# Patient Record
Sex: Male | Born: 1995 | Race: White | Hispanic: No | Marital: Single | Smoking: Never smoker
Health system: Southern US, Community
[De-identification: ages and names within clinical notes are randomized; demographics above are authoritative.]

## PROBLEM LIST (undated history)

## (undated) DIAGNOSIS — F909 Attention-deficit hyperactivity disorder, unspecified type: Secondary | ICD-10-CM

## (undated) HISTORY — DX: Attention-deficit hyperactivity disorder, unspecified type: F90.9

## (undated) HISTORY — PX: TYMPANOSTOMY TUBE PLACEMENT: SHX32

---
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
## 2017-01-06 ENCOUNTER — Other Ambulatory Visit: Payer: Self-pay | Admitting: Family

## 2017-01-06 MED ORDER — EVEKEO 10 MG PO TABS: 10.0000 mg | ORAL_TABLET | Freq: Every day | ORAL | 0 refills | Status: AC

## 2017-01-06 NOTE — Telephone Encounter (Signed)
Mom called in a new RX request for [employer] for [employer]. They will Pick up this RX. Pt has apt scheduled for December 18. That's when he will be home from school.  Approved by provider. jd

## 2017-01-06 NOTE — Telephone Encounter (Signed)
Printed Rx for [name] 10 mg and placed at front desk for pick-up  

## 2017-04-08 ENCOUNTER — Encounter: Payer: Self-pay | Admitting: Family

## 2017-04-08 ENCOUNTER — Ambulatory Visit: Payer: 59 | Admitting: Family

## 2017-04-08 VITALS — BP 110/64 | HR 68 | Resp 16 | Ht 73.75 in | Wt 195.6 lb

## 2017-04-08 DIAGNOSIS — F900 Attention-deficit hyperactivity disorder, predominantly inattentive type: Secondary | ICD-10-CM | POA: Diagnosis not present

## 2017-04-08 DIAGNOSIS — Z719 Counseling, unspecified: Secondary | ICD-10-CM

## 2017-04-08 DIAGNOSIS — Z79899 Other long term (current) drug therapy: Secondary | ICD-10-CM | POA: Diagnosis not present

## 2017-04-08 NOTE — Progress Notes (Signed)
[facility] [address] [place] [address] Dept: [phone] Dept Fax: [phone] Loc: [phone] Loc Fax: [phone]  Medical Follow-up  Patient ID: Derrick Reeves, male  DOB: 1995/12/07, 21 y.o.  MRN: [id number]  Date of Evaluation: 04/08/17  PCP: Eartha InchBadger, Michael C, MD  Accompanied by: self Patient Lives with: on campus   HISTORY/CURRENT STATUS:  HPI  Patient here for routine follow up related to ADHD, learning problems, and medication management. Patient here with his mother today for the visit. Patient doing well at school with a 3.02 GPA this 1st semester and will stay through the spring semester then transfer closer to home. Has not taken his medication consistently and been non-compliant most days, but no side effects reported.   EDUCATION: School: [employer]  Year/Grade: Freshman Homework Time:Increased amount of time each night. Performance/Grades: average Services: Other: Disability Services Activities/Exercise: daily workouts, lifting 3 days/week, 05/29/17 1st game  MEDICAL HISTORY: Appetite: Good  MVI/Other: None Fruits/Vegs:Some Calcium: Some Iron:Some  Sleep: Bedtime: varies each night Sleep Concerns: Initiation/Maintenance/Other: depends on the night for how much sleep he gets  Individual Medical History/Review of System Changes? None reported recently  Allergies: Patient has no known allergies.  Current Medications:  Current Outpatient Medications:  .  [name] 10 MG TABS, Take 10-20 mg by mouth daily., Disp: 60 tablet, Rfl: 0 Medication Side Effects: None  Family Medical/Social History Changes?: None  MENTAL HEALTH: Mental Health Issues: None reported   PHYSICAL EXAM: Vitals:  Today's Vitals   04/08/17 1530  BP: 110/64  Pulse: 68  Resp: 16  Weight: 195 lb 9.6 oz (88.7 kg)  Height: 6' 1.75"  (1.873 m)  PainSc: 0-No pain  , 77 %ile (Z= 0.74) based on CDC (Boys, 2-20 Years) BMI-for-age based on BMI available as of 04/08/2017.  General Exam: Physical Exam  Constitutional: He is oriented to person, place, and time. He appears well-developed and well-nourished.  HENT:  Head: Normocephalic and atraumatic.  Right Ear: External ear normal.  Left Ear: External ear normal.  Nose: Nose normal.  Mouth/Throat: Oropharynx is clear and moist.  Eyes: Conjunctivae and EOM are normal. Pupils are equal, round, and reactive to light.  Neck: Trachea normal, normal range of motion and full passive range of motion without pain. Neck supple.  Cardiovascular: Normal rate, regular rhythm, normal heart sounds and intact distal pulses.  Pulmonary/Chest: Effort normal and breath sounds normal.  Abdominal: Soft. Bowel sounds are normal.  Genitourinary:  Genitourinary Comments: Deferred  Musculoskeletal: Normal range of motion.  Neurological: He is alert and oriented to person, place, and time. He has normal reflexes.  Skin: Skin is warm, dry and intact. Capillary refill takes less than 2 seconds.  Psychiatric: He has a normal mood and affect. His behavior is normal. Judgment and thought content normal.  Vitals reviewed.  Review of Systems  Psychiatric/Behavioral: Positive for decreased concentration.  All other systems reviewed and are negative.  Patient with no concerns for toileting. Daily stool, no constipation or diarrhea. Void urine no difficulty. No enuresis.   Participate in daily oral hygiene to include brushing and flossing.  Neurological: oriented to time, place, and person Cranial Nerves: normal  Neuromuscular:  Motor Mass: Normal Tone: Normal  Strength: Normal DTRs: 2+ and symmetric Overflow: None Reflexes: no tremors noted Sensory Exam: Vibratory: Intact  Fine Touch: Intact  Testing/Developmental Screens:  ASRS-10/2 scored by patient and counseled at today's  visit     DIAGNOSES:    ICD-10-CM   1. ADHD (attention deficit hyperactivity disorder), inattentive type F90.0   2. Medication management Z79.899   3. Patient counseled Z71.9     RECOMMENDATIONS: 3 month follow up or 6 month depending on patient's schedule for spring at home. Counseled on adherence and medication management with [name] 10 mg 1-2 tablets daily, prn. Non-compliant most of the fall semester and no Rx provided at today's visit. To restart this coming semester.   Counseled patient along with mother on medication administration, effects, and possible side effects. ADHD medications discussed to include different medications and pharmacologic properties of each. Recommendation for specific medication to include dose, administration, expected effects, possible side effects and the risk to benefit ratio of medication management at today's visit for [name] daily dosing of 10 mg BID.   Information regarding school progress provided by patient with positive progress the 1st semester and will stay for 2nd semester freshman year. Discussed transferring schools for next year closer to home and provided support.   Patient encouraged to get enough sleep with school and baseball daily. Reviewed sleep hygiene while at college to ensure his ability to focus and decrease the likelihood of injuries.   Recommended proper nutrition with increased exercise daily. Discussed calories and protein to assist with muscle building and support with increase daily actiivty.  Directed to f/u with PCP routinely, dentist as recommended, MVI daily, good nutrition daily, sleep hygiene and exercise daily for health maintenance.   NEXT APPOINTMENT: No Follow-up on file.  More than 50% of the appointment was spent counseling and discussing diagnosis and management of symptoms with the patient and family.  Carron Curieawn M Paretta-Leahey, NP Counseling Time: 30 mins Total Contact Time: 40 mins

## 2017-09-08 ENCOUNTER — Ambulatory Visit: Payer: 59 | Attending: Orthopedic Surgery | Admitting: Physical Therapy

## 2017-09-08 ENCOUNTER — Encounter: Payer: Self-pay | Admitting: Physical Therapy

## 2017-09-08 DIAGNOSIS — M62838 Other muscle spasm: Secondary | ICD-10-CM | POA: Diagnosis present

## 2017-09-08 DIAGNOSIS — M25611 Stiffness of right shoulder, not elsewhere classified: Secondary | ICD-10-CM | POA: Diagnosis present

## 2017-09-08 DIAGNOSIS — M25511 Pain in right shoulder: Secondary | ICD-10-CM | POA: Insufficient documentation

## 2017-09-08 DIAGNOSIS — R600 Localized edema: Secondary | ICD-10-CM | POA: Diagnosis present

## 2017-09-08 NOTE — Patient Instructions (Signed)
Trigger Point Dry Needling  . What is Trigger Point Dry Needling (DN)? o DN is a physical therapy technique used to treat muscle pain and dysfunction. Specifically, DN helps deactivate muscle trigger points (muscle knots).  o A thin filiform needle is used to penetrate the skin and stimulate the underlying trigger point. The goal is for a local twitch response (LTR) to occur and for the trigger point to relax. No medication of any kind is injected during the procedure.   . What Does Trigger Point Dry Needling Feel Like?  o The procedure feels different for each individual patient. Some patients report that they do not actually feel the needle enter the skin and overall the process is not painful. Very mild bleeding may occur. However, many patients feel a deep cramping in the muscle in which the needle was inserted. This is the local twitch response.   Marland Kitchen How Will I feel after the treatment? o Soreness is normal, and the onset of soreness may not occur for a few hours. Typically this soreness does not last longer than two days.  o Bruising is uncommon, however; ice can be used to decrease any possible bruising.  o In rare cases feeling tired or nauseous after the treatment is normal. In addition, your symptoms may get worse before they get better, this period will typically not last longer than 24 hours.   . What Can I do After My Treatment? o Increase your hydration by drinking more water for the next 24 hours. o You may place ice or heat on the areas treated that have become sore, however, do not use heat on inflamed or bruised areas. Heat often brings more relief post needling. o You can continue your regular activities, but vigorous activity is not recommended initially after the treatment for 24 hours. o DN is best combined with other physical therapy such as strengthening, stretching, and other therapies.     Access Code: [email or web address]  URL: [email or web address]  Date:  09/08/2017  Prepared by: Stacie Glaze   Exercises  Shoulder Extension with Resistance - 10 reps - 3 sets - 1x daily - 7x weekly  Standing Lat Pull Down with Resistance - Elbows Bent - 10 reps - 3 sets - 5 hold - 1x daily - 7x weekly  Standing Shoulder Row with Anchored Resistance - 10 reps - 3 sets - 5 hold - 1x daily - 7x weekly  Standing Shoulder Horizontal Abduction with Resistance - 10 reps - 3 sets - 5 hold - 1x daily - 7x weekly  Shoulder External Rotation and Scapular Retraction with Resistance - 10 reps - 3 sets - 1x daily - 7x weekly  Dynamic Hug with Resistance - 10 reps - 3 sets - 5 hold - 1x daily - 7x weekly

## 2017-09-08 NOTE — Therapy (Signed)
[facility] [address] [place], [place], [address] Phone: [phone]   Fax:  [phone]  Physical Therapy Evaluation  Patient Details  Name: Derrick Reeves MRN: [id number] Date of Birth: 29-Jul-1995 Referring Provider: Dr. Stephanie Coup  at [facility] sports medicine, Dion Saucier   Encounter Date: 09/08/2017  PT End of Session - 09/08/17 1339    Visit Number  1    Date for PT Re-Evaluation  11/08/17    PT Start Time  1300    PT Stop Time  1355    PT Time Calculation (min)  55 min    Activity Tolerance  Patient tolerated treatment well    Behavior During Therapy  [facility] for tasks assessed/performed       Past Medical History:  Diagnosis Date  . ADHD (attention deficit hyperactivity disorder)     Past Surgical History:  Procedure Laterality Date  . TYMPANOSTOMY TUBE PLACEMENT      There were no vitals filed for this visit.   Subjective Assessment - 09/08/17 1257    Subjective  Patient is a college baseball player, reports that he has been having pain for about 2 months, just finished freshmen year.  Patient plays 3rd base, is right handed.  He reports that the trainer at school tried dry needling and cupping, reports this did not help.      Patient Stated Goals  have less pain    Currently in Pain?  Yes    Pain Score  2     Pain Location  Shoulder    Pain Orientation  Right    Pain Descriptors / Indicators  Aching "a catch"    Pain Type  Acute pain    Pain Onset  More than a month ago    Pain Frequency  Intermittent    Aggravating Factors   throwing, bench press, reaching overhead, pushups, pain up to 8/10    Pain Relieving Factors  rest, not throwing, pain is a 2/10 at best    Effect of Pain on Daily Activities  limits throwing, limits working out         [facility] PT Assessment - 09/08/17 0001      Assessment   Medical Diagnosis  right shoulder pain    Referring Provider  Dr. Stephanie Coup  at [facility]  sports medicine, Dion Saucier    Onset Date/Surgical Date  11/08/17    Hand Dominance  Right    Prior Therapy  with trainer at school      Precautions   Precautions  None      Balance Screen   Has the patient fallen in the past 6 months  No    Has the patient had a decrease in activity level because of a fear of falling?   No    Is the patient reluctant to leave their home because of a fear of falling?   No      Home Environment   Additional Comments  independent      Prior Function   Level of Independence  Independent    Vocation  Student    Vocation Requirements  college baseball player, lifts weights    Leisure  baseball year round      Posture/Postural Control   Posture Comments  fwd head, rounded shoulders, winged scapula      ROM / Strength   AROM / PROM / Strength  AROM;Strength;PROM      AROM   AROM Assessment  Site  Shoulder    Right/Left Shoulder  Right    Right Shoulder Flexion  155 Degrees pain    Right Shoulder ABduction  155 Degrees pain    Right Shoulder Internal Rotation  56 Degrees    Right Shoulder External Rotation  85 Degrees      PROM   PROM Assessment Site  Shoulder    Right/Left Shoulder  Right    Right Shoulder Flexion  175 Degrees pain    Right Shoulder ABduction  170 Degrees    Right Shoulder Internal Rotation  75 Degrees pain    Right Shoulder External Rotation  90 Degrees      Strength   Overall Strength Comments  flexion, abduction and IR 4+/5 with mild pain, ER was 4-/5 with pain a 7/10      Palpation   Palpation comment  he is very tender along the infraspinatus mms, he reports"that is it" when palpating the mm bellis of the infraspinatus , he does have winged scapulae that I can get my fingers underneath      Special Tests   Other special tests  negative empty can, + impingemt                Objective measurements completed on examination: See above findings.      [facility] Adult PT Treatment/Exercise - 09/08/17 0001       Modalities   Modalities  Moist Heat;Electrical Stimulation      Moist Heat Therapy   Number Minutes Moist Heat  15 Minutes    Moist Heat Location  Shoulder      Electrical Stimulation   Electrical Stimulation Location  right scapular area    Electrical Stimulation Action  IFC    Electrical Stimulation Parameters  supine    Electrical Stimulation Goals  Pain       Trigger Point Dry Needling - 09/08/17 1341    Consent Given?  Yes    Education Handout Provided  Yes    Muscles Treated Upper Body  Infraspinatus    Infraspinatus Response  Twitch response elicited             PT Short Term Goals - 09/08/17 1350      PT SHORT TERM GOAL #1   Title  independent with initial HEP    Time  2    Period  Weeks    Status  New        PT Long Term Goals - 09/08/17 1351      PT LONG TERM GOAL #1   Title  decrease pain 50%    Time  8    Period  Weeks    Status  New      PT LONG TERM GOAL #2   Title  increase ROM to WNL's    Time  8    Period  Weeks    Status  New      PT LONG TERM GOAL #3   Title  increase ER strength to 4+/5    Time  8    Period  Weeks    Status  New      PT LONG TERM GOAL #4   Title  throw without pain    Time  8    Period  Weeks    Status  New             Plan - 09/08/17 1341    Clinical Impression Statement  Patient is a Scientist, physiological,  he is right handed, he reports that he has been having right shoulder/scapular pain for 2 months.  He is unsure of a specific cause.  Reports pain throwing, pushing, pushups, reaching overhead.  Negative RC tests, + impingment test, has scapular winging, very tight and tender in the right infraspinatus.  He has dsome limitations in his ROM with the most limitation being IR, he is very weak with ER due to pain    Clinical Presentation  Stable    Clinical Decision Making  Low    Rehab Potential  Good    PT Frequency  2x / week    PT Duration  8 weeks    PT Treatment/Interventions  ADLs/Self Care  Home Management;Cryotherapy;Electrical Stimulation;Moist Heat;Iontophoresis /ml Dexamethasone;Neuromuscular re-education;Therapeutic exercise;Therapeutic activities;Patient/family education;Manual techniques;Dry needling;Vasopneumatic Device    PT Next Visit Plan  start scapular stabilization, see if dry needling helped    Consulted and Agree with Plan of Care  Patient       Patient will benefit from skilled therapeutic intervention in order to improve the following deficits and impairments:  Increased fascial restricitons, Decreased range of motion, Increased muscle spasms, Pain, Hypermobility, Impaired flexibility, Improper body mechanics, Postural dysfunction, Decreased strength, Increased edema  Visit Diagnosis: Acute pain of right shoulder - Plan: PT plan of care cert/re-cert  Other muscle spasm - Plan: PT plan of care cert/re-cert  Stiffness of right shoulder, not elsewhere classified - Plan: PT plan of care cert/re-cert  Localized edema - Plan: PT plan of care cert/re-cert     Problem List Patient Active Problem List   Diagnosis Date Noted  . ADHD (attention deficit hyperactivity disorder), inattentive type 09/15/2015    Jearld Lesch., PT 09/08/2017, 1:54 PM  [facility] [place] [facility] [address] [place], [place], [address] Phone: [phone]   Fax:  [phone]  Name: Derrick Reeves MRN: [id number] Date of Birth: 03/26/1996

## 2017-09-11 ENCOUNTER — Encounter: Payer: Self-pay | Admitting: Physical Therapy

## 2017-09-11 ENCOUNTER — Ambulatory Visit: Payer: 59 | Admitting: Physical Therapy

## 2017-09-12 ENCOUNTER — Ambulatory Visit: Payer: 59 | Admitting: Physical Therapy

## 2017-09-12 ENCOUNTER — Encounter: Payer: Self-pay | Admitting: Physical Therapy

## 2017-09-12 DIAGNOSIS — M25511 Pain in right shoulder: Secondary | ICD-10-CM | POA: Diagnosis not present

## 2017-09-12 DIAGNOSIS — M62838 Other muscle spasm: Secondary | ICD-10-CM

## 2017-09-12 DIAGNOSIS — M25611 Stiffness of right shoulder, not elsewhere classified: Secondary | ICD-10-CM

## 2017-09-12 DIAGNOSIS — R600 Localized edema: Secondary | ICD-10-CM

## 2017-09-12 NOTE — Therapy (Signed)
[facility] [place] [facility] [address] [place], [place], [address] Phone: [phone]   Fax:  [phone]  Physical Therapy Treatment  Patient Details  Name: Derrick Reeves MRN: [id number] Date of Birth: July 24, 1995 Referring Provider: Dr. Stephanie Coup  at [facility] sports medicine, Dion Saucier   Encounter Date: 09/12/2017  PT End of Session - 09/12/17 0921    Visit Number  2    Date for PT Re-Evaluation  11/08/17    PT Start Time  0840    PT Stop Time  0940    PT Time Calculation (min)  60 min    Activity Tolerance  Patient tolerated treatment well    Behavior During Therapy  [facility] for tasks assessed/performed       Past Medical History:  Diagnosis Date  . ADHD (attention deficit hyperactivity disorder)     Past Surgical History:  Procedure Laterality Date  . TYMPANOSTOMY TUBE PLACEMENT      There were no vitals filed for this visit.  Subjective Assessment - 09/12/17 0851    Subjective  Patient reports he was sore for a few hours after but then felt a little better    Currently in Pain?  Yes    Pain Score  2     Pain Location  Scapula    Pain Orientation  Right                       [facility] Adult PT Treatment/Exercise - 09/12/17 0001      Exercises   Exercises  Shoulder      Shoulder Exercises: Prone   Other Prone Exercises  [place]oughston's 5#-2#    Other Prone Exercises  serratus push and hold 10 s x 10 reps      Shoulder Exercises: Standing   External Rotation  20 reps    External Rotation Weight (lbs)  5# pulleys, some pain in the infraspinatus area    External Rotation Limitations  tried 90/90 this caused more pain      Shoulder Exercises: ROM/Strengthening   UBE (Upper Arm Bike)  constant work 40watts x 5 minutes    Lat Pull  3.5 plate;25 reps    Cybex Row  3.5 plate;20 reps    Cybex Row Limitations  cues for posture and form, to get good scap retraction    Other ROM/Strengthening Exercises  bow  pulls 20#, face pulls 35#               PT Short Term Goals - 09/08/17 1350      PT SHORT TERM GOAL #1   Title  independent with initial HEP    Time  2    Period  Weeks    Status  New        PT Long Term Goals - 09/08/17 1351      PT LONG TERM GOAL #1   Title  decrease pain 50%    Time  8    Period  Weeks    Status  New      PT LONG TERM GOAL #2   Title  increase ROM to WNL's    Time  8    Period  Weeks    Status  New      PT LONG TERM GOAL #3   Title  increase ER strength to 4+/5    Time  8    Period  Weeks    Status  New  PT LONG TERM GOAL #4   Title  throw without pain    Time  8    Period  Weeks    Status  New            Plan - 09/12/17 4098    Clinical Impression Statement  patient needs verbal and tactile cues for good scapular retraction, had pain today only with ER.  Has some weakness noted with ER and scapular stabilization    PT Next Visit Plan  may return to dry needling of supra and infraspinatus    Consulted and Agree with Plan of Care  Patient       Patient will benefit from skilled therapeutic intervention in order to improve the following deficits and impairments:  Increased fascial restricitons, Decreased range of motion, Increased muscle spasms, Pain, Hypermobility, Impaired flexibility, Improper body mechanics, Postural dysfunction, Decreased strength, Increased edema  Visit Diagnosis: Acute pain of right shoulder  Other muscle spasm  Stiffness of right shoulder, not elsewhere classified  Localized edema     Problem List Patient Active Problem List   Diagnosis Date Noted  . ADHD (attention deficit hyperactivity disorder), inattentive type 09/15/2015    Jearld Lesch., PT 09/12/2017, 9:23 AM  [facility] [address] [place], [place], [address] Phone: [phone]   Fax:  [phone]  Name: Derrick Reeves MRN: [id number] Date of Birth:  30-Mar-1996

## 2017-09-16 ENCOUNTER — Ambulatory Visit: Payer: 59 | Admitting: Physical Therapy

## 2017-09-16 ENCOUNTER — Encounter: Payer: Self-pay | Admitting: Physical Therapy

## 2017-09-16 DIAGNOSIS — M25511 Pain in right shoulder: Secondary | ICD-10-CM | POA: Diagnosis not present

## 2017-09-16 DIAGNOSIS — M62838 Other muscle spasm: Secondary | ICD-10-CM

## 2017-09-16 DIAGNOSIS — M25611 Stiffness of right shoulder, not elsewhere classified: Secondary | ICD-10-CM

## 2017-09-16 NOTE — Therapy (Signed)
[facility] [place] [facility] [address] [place], [place], [address] Phone: [phone]   Fax:  [phone]  Physical Therapy Treatment  Patient Details  Name: Derrick Reeves MRN: [id number] Date of Birth: September 27, 1995 Referring Provider: Dr. Stephanie Coup  at [facility] sports medicine, Dion Saucier   Encounter Date: 09/16/2017  PT End of Session - 09/16/17 1430    Visit Number  3    Date for PT Re-Evaluation  11/08/17    PT Start Time  1345    PT Stop Time  1430    PT Time Calculation (min)  45 min    Activity Tolerance  Patient tolerated treatment well    Behavior During Therapy  [facility] for tasks assessed/performed       Past Medical History:  Diagnosis Date  . ADHD (attention deficit hyperactivity disorder)     Past Surgical History:  Procedure Laterality Date  . TYMPANOSTOMY TUBE PLACEMENT      There were no vitals filed for this visit.  Subjective Assessment - 09/16/17 1346    Subjective  Pt reports that he is doing ok. He reports that's his shoulder did lock up on him reaching back in his car.     Currently in Pain?  No/denies    Pain Score  0-No pain                       [facility] Adult PT Treatment/Exercise - 09/16/17 0001      Exercises   Exercises  Shoulder      Shoulder Exercises: Standing   Extension  Both;20 reps;Theraband    Theraband Level (Shoulder Extension)  Level 3 (Green)    Row  Theraband;Left;20 reps ER at end range    Other Standing Exercises  ER IR into wall flex 90 with pillow case    Other Standing Exercises  drop and catch flex and abd 2x15, 90/90 D2 ext 3lb      Shoulder Exercises: ROM/Strengthening   UBE (Upper Arm Bike)  constant work 40watts x 5 minutes    Other ROM/Strengthening Exercises  Scapular dowanward rotation on lat pull down 2x10 35lb    Other ROM/Strengthening Exercises  RUE ER abd 90      Shoulder Exercises: Power Soil scientist Exercises  serratus  presses 35lb 2x10       Modalities   Modalities  Ultrasound      Ultrasound   Ultrasound Location  R scapula    Ultrasound Parameters  Combo 1.2w/cm2     Ultrasound Goals  Pain               PT Short Term Goals - 09/08/17 1350      PT SHORT TERM GOAL #1   Title  independent with initial HEP    Time  2    Period  Weeks    Status  New        PT Long Term Goals - 09/08/17 1351      PT LONG TERM GOAL #1   Title  decrease pain 50%    Time  8    Period  Weeks    Status  New      PT LONG TERM GOAL #2   Title  increase ROM to WNL's    Time  8    Period  Weeks    Status  New      PT LONG TERM GOAL #3  Title  increase ER strength to 4+/5    Time  8    Period  Weeks    Status  New      PT LONG TERM GOAL #4   Title  throw without pain    Time  8    Period  Weeks    Status  New            Plan - 09/16/17 1430    Clinical Impression Statement  Continues to need verbal and tactile cues for proper scapular retraction. He does reports some pain with R shoulder ER. Scapular downward rotation is weak and fatigues quick. Some pain reported with ext and ER at end. R scapula does pop when shoulder flexed to 90 ER/IR into wall.    Rehab Potential  Good    PT Frequency  2x / week    PT Treatment/Interventions  ADLs/Self Care Home Management;Cryotherapy;Electrical Stimulation;Moist Heat;Iontophoresis /ml Dexamethasone;Neuromuscular re-education;Therapeutic exercise;Therapeutic activities;Patient/family education;Manual techniques;Dry needling;Vasopneumatic Device    PT Next Visit Plan  may return to dry needling of supra and infraspinatus       Patient will benefit from skilled therapeutic intervention in order to improve the following deficits and impairments:  Increased fascial restricitons, Decreased range of motion, Increased muscle spasms, Pain, Hypermobility, Impaired flexibility, Improper body mechanics, Postural dysfunction, Decreased strength, Increased  edema  Visit Diagnosis: Acute pain of right shoulder  Other muscle spasm  Stiffness of right shoulder, not elsewhere classified     Problem List Patient Active Problem List   Diagnosis Date Noted  . ADHD (attention deficit hyperactivity disorder), inattentive type 09/15/2015    Grayce Sessions, PTA 09/16/2017, 2:34 PM  [facility] [address] [place], [place], [address] Phone: [phone]   Fax:  [phone]  Name: Derrick Reeves MRN: [id number] Date of Birth: 01-14-1996

## 2017-09-18 ENCOUNTER — Ambulatory Visit: Payer: 59 | Admitting: Physical Therapy

## 2017-09-18 ENCOUNTER — Encounter: Payer: Self-pay | Admitting: Physical Therapy

## 2017-09-18 DIAGNOSIS — R600 Localized edema: Secondary | ICD-10-CM

## 2017-09-18 DIAGNOSIS — M62838 Other muscle spasm: Secondary | ICD-10-CM

## 2017-09-18 DIAGNOSIS — M25511 Pain in right shoulder: Secondary | ICD-10-CM | POA: Diagnosis not present

## 2017-09-18 DIAGNOSIS — M25611 Stiffness of right shoulder, not elsewhere classified: Secondary | ICD-10-CM

## 2017-09-18 NOTE — Therapy (Signed)
[facility] [place] [facility] [address] [place], [place], [address] Phone: [phone]   Fax:  [phone]  Physical Therapy Treatment  Patient Details  Name: Derrick Reeves MRN: [id number] Date of Birth: 1995/06/23 Referring Provider: Dr. Stephanie Coup  at [facility] sports medicine, Dion Saucier   Encounter Date: 09/18/2017  PT End of Session - 09/18/17 1419    Visit Number  4    Date for PT Re-Evaluation  11/08/17    PT Start Time  1345    PT Stop Time  1435    PT Time Calculation (min)  50 min    Activity Tolerance  Patient tolerated treatment well    Behavior During Therapy  [facility] for tasks assessed/performed       Past Medical History:  Diagnosis Date  . ADHD (attention deficit hyperactivity disorder)     Past Surgical History:  Procedure Laterality Date  . TYMPANOSTOMY TUBE PLACEMENT      There were no vitals filed for this visit.  Subjective Assessment - 09/18/17 1346    Subjective  pt reports some soreness after last treatment session. "Good today haven't done much, woke up came here"    Currently in Pain?  Yes    Pain Score  4     Pain Location  Scapula    Pain Orientation  Right    Pain Descriptors / Indicators  Aching                       [facility] Adult PT Treatment/Exercise - 09/18/17 0001      Exercises   Exercises  Shoulder      Shoulder Exercises: Seated   Other Seated Exercises  Hight to low rows 45lb 2x12    Other Seated Exercises  serratus pressus 35lb x10, 25lb x10; 20lb x10       Shoulder Exercises: Standing   Horizontal ABduction  Right;10 reps;Theraband xw    Theraband Level (Shoulder Horizontal ABduction)  Level 3 (Green)    Other Standing Exercises  4 way scap stab x10 each 2lb     Other Standing Exercises  drop and catch flex and abd 2x15, 90/90 D2 ext 3lb      Shoulder Exercises: ROM/Strengthening   UBE (Upper Arm Bike)  constant work 40watts x 5 minutes    Other  ROM/Strengthening Exercises  Scapular dowanward rotation on lat pull down 2x10 35lb    Other ROM/Strengthening Exercises  seat alt rows 35lb 2x8      Modalities   Modalities  Moist Heat;Electrical Stimulation      Moist Heat Therapy   Number Minutes Moist Heat  15 Minutes    Moist Heat Location  Shoulder      Electrical Stimulation   Electrical Stimulation Location  right scapular area    Electrical Stimulation Action  IFC    Electrical Stimulation Parameters  supine    Electrical Stimulation Goals  Pain               PT Short Term Goals - 09/18/17 1423      PT SHORT TERM GOAL #1   Title  independent with initial HEP    Status  Achieved        PT Long Term Goals - 09/18/17 1423      PT LONG TERM GOAL #1   Title  decrease pain 50%    Status  On-going  Plan - 09/18/17 1420    Clinical Impression Statement  Pt with some pain and discomfort with serratus presses, ER at 90/90, and seated alternating rows. Despite complaints he was able to complete all of today's interventions. Pt does have a  winging R scapula. He does reports some popping with horizontal abduction.    Rehab Potential  Good    PT Frequency  2x / week    PT Duration  8 weeks    PT Treatment/Interventions  ADLs/Self Care Home Management;Cryotherapy;Electrical Stimulation;Moist Heat;Iontophoresis /ml Dexamethasone;Neuromuscular re-education;Therapeutic exercise;Therapeutic activities;Patient/family education;Manual techniques;Dry needling;Vasopneumatic Device    PT Next Visit Plan  may return to dry needling of supra and infraspinatus       Patient will benefit from skilled therapeutic intervention in order to improve the following deficits and impairments:  Increased fascial restricitons, Decreased range of motion, Increased muscle spasms, Pain, Hypermobility, Impaired flexibility, Improper body mechanics, Postural dysfunction, Decreased strength, Increased edema  Visit  Diagnosis: Acute pain of right shoulder  Other muscle spasm  Stiffness of right shoulder, not elsewhere classified  Localized edema     Problem List Patient Active Problem List   Diagnosis Date Noted  . ADHD (attention deficit hyperactivity disorder), inattentive type 09/15/2015    Grayce Sessions, PTA 09/18/2017, 2:24 PM  [facility] [address] [place], [place], [address] Phone: [phone]   Fax:  [phone]  Name: Derrick Reeves MRN: [id number] Date of Birth: 1996-03-16

## 2017-09-23 ENCOUNTER — Encounter: Payer: Self-pay | Admitting: Physical Therapy

## 2017-09-23 ENCOUNTER — Ambulatory Visit: Payer: 59 | Attending: Family Medicine | Admitting: Physical Therapy

## 2017-09-23 DIAGNOSIS — M25611 Stiffness of right shoulder, not elsewhere classified: Secondary | ICD-10-CM | POA: Insufficient documentation

## 2017-09-23 DIAGNOSIS — M62838 Other muscle spasm: Secondary | ICD-10-CM | POA: Insufficient documentation

## 2017-09-23 DIAGNOSIS — R600 Localized edema: Secondary | ICD-10-CM

## 2017-09-23 DIAGNOSIS — M25511 Pain in right shoulder: Secondary | ICD-10-CM | POA: Insufficient documentation

## 2017-09-23 NOTE — Therapy (Signed)
[facility] [place], [place], [address] Phone: [phone]   Fax:  [phone]  Physical Therapy Treatment  Patient Details  Name: Derrick Reeves MRN: [id number] Date of Birth: [birth year] Referring Provider: Dr. Alysia Penna  at [facility] sports medicine, Mardelle Matte   Encounter Date: 09/23/2017  PT End of Session - 09/23/17 1424    Visit Number  5    Date for PT Re-Evaluation  11/08/17    PT Start Time  1345    PT Stop Time  1440    PT Time Calculation (min)  55 min    Activity Tolerance  Patient tolerated treatment well    Behavior During Therapy  [facility] for tasks assessed/performed       Past Medical History:  Diagnosis Date  . ADHD (attention deficit hyperactivity disorder)     Past Surgical History:  Procedure Laterality Date  . TYMPANOSTOMY TUBE PLACEMENT      There were no vitals filed for this visit.  Subjective Assessment - 09/23/17 1343    Subjective  Pt reports that he feels like it is a little better, but certain movements trigger it.     Currently in Pain?  No/denies    Pain Score  0-No pain                       [facility] Adult PT Treatment/Exercise - 09/23/17 0001      Shoulder Exercises: Seated   Other Seated Exercises  Hight to low rows 55lb 2x10; kneeling RUE abd 90 ER flips with red ball 2x5    Other Seated Exercises  serratus pressus 35lb x10, 25lb x10; 20lb x10       Shoulder Exercises: Standing   Other Standing Exercises  isometric overhead hold then 5 presses 60 ft x 3    Other Standing Exercises  drop and catch flex and abd 2x15, 90/90 D2 ext 3lb      Shoulder Exercises: ROM/Strengthening   UBE (Upper Arm Bike)  constant work 40watts x 5 minutes    Other ROM/Strengthening Exercises  Scapular dowanward rotation on lat pull down 2x10 35lb followed by 5 pull downs after each set     Other ROM/Strengthening Exercises  seat alt rows 35lb 2x8      Shoulder  Exercises: Stretch   Other Shoulder Stretches  Sleepers stretch 5x10''      Shoulder Exercises: Body Blade   Flexion  30 seconds;2 reps    External Rotation  30 seconds;2 reps    Other Body Blade Exercises  and 90 x15       Modalities   Modalities  Moist Heat;Electrical Stimulation      Moist Heat Therapy   Number Minutes Moist Heat  15 Minutes    Moist Heat Location  Shoulder      Electrical Stimulation   Electrical Stimulation Location  right scapular area    Electrical Stimulation Action  IFC    Electrical Stimulation Parameters  supine    Electrical Stimulation Goals  Pain               PT Short Term Goals - 09/18/17 1423      PT SHORT TERM GOAL #1   Title  independent with initial HEP    Status  Achieved        PT Long Term Goals - 09/23/17 1426      PT LONG TERM GOAL #1   Title  decrease pain 50%    Status  Partially Met      PT LONG TERM GOAL #2   Title  increase ROM to WNL's    Status  Partially Met      PT LONG TERM GOAL #3   Title  increase ER strength to 4+/5    Status  On-going      PT LONG TERM GOAL #4   Title  throw without pain    Status  On-going            Plan - 09/23/17 1429    Clinical Impression Statement  Pt able to do all of today's exercises. Burning reported with ball drop and catch while RUE flex to 90 and abd to 90. Popping sensation with alternating seated rows. Burning with overhead isolation reported too. Pain reported when kneeling RUE abd to 90 with backward ball tosses. Pt reports that's he feels better overall.     Rehab Potential  Good    PT Frequency  2x / week    PT Duration  8 weeks    PT Treatment/Interventions  ADLs/Self Care Home Management;Cryotherapy;Electrical Stimulation;Moist Heat;Iontophoresis 74m/ml Dexamethasone;Neuromuscular re-education;Therapeutic exercise;Therapeutic activities;Patient/family education;Manual techniques;Dry needling;Vasopneumatic Device    PT Next Visit Plan  may return to dry  needling of supra and infraspinatus, Scapular stabilization       Patient will benefit from skilled therapeutic intervention in order to improve the following deficits and impairments:  Increased fascial restricitons, Decreased range of motion, Increased muscle spasms, Pain, Hypermobility, Impaired flexibility, Improper body mechanics, Postural dysfunction, Decreased strength, Increased edema  Visit Diagnosis: Acute pain of right shoulder  Other muscle spasm  Stiffness of right shoulder, not elsewhere classified  Localized edema     Problem List Patient Active Problem List   Diagnosis Date Noted  . ADHD (attention deficit hyperactivity disorder), inattentive type 09/15/2015    RScot Jun PTA 09/23/2017, 2:31 PM  [place] [address]Phone: [phone]  Fax:  [phone] Name: Derrick LacherMRN: [id number] of Birth: 8April 12, 1997

## 2017-09-25 ENCOUNTER — Encounter: Payer: Self-pay | Admitting: Physical Therapy

## 2017-09-25 ENCOUNTER — Ambulatory Visit: Payer: 59 | Admitting: Physical Therapy

## 2017-09-25 DIAGNOSIS — M25611 Stiffness of right shoulder, not elsewhere classified: Secondary | ICD-10-CM

## 2017-09-25 DIAGNOSIS — M25511 Pain in right shoulder: Secondary | ICD-10-CM

## 2017-09-25 DIAGNOSIS — R600 Localized edema: Secondary | ICD-10-CM

## 2017-09-25 DIAGNOSIS — M62838 Other muscle spasm: Secondary | ICD-10-CM

## 2017-09-25 NOTE — Therapy (Signed)
[place], [place], [address] Phone: [phone]   Fax:  [phone]  Physical Therapy Treatment  Patient Details  Name: Derrick Reeves MRN: [id number] Date of Birth: [birth year] Referring Provider: Dr. Alysia Penna  at [facility] sports medicine, Mardelle Matte   Encounter Date: 09/25/2017  PT End of Session - 09/25/17 1422    Visit Number  6    Date for PT Re-Evaluation  11/08/17    PT Start Time  1343    PT Stop Time  1438    PT Time Calculation (min)  55 min    Activity Tolerance  Patient tolerated treatment well    Behavior During Therapy  [facility] for tasks assessed/performed       Past Medical History:  Diagnosis Date  . ADHD (attention deficit hyperactivity disorder)     Past Surgical History:  Procedure Laterality Date  . TYMPANOSTOMY TUBE PLACEMENT      There were no vitals filed for this visit.  Subjective Assessment - 09/25/17 1346    Subjective  "Feeling all right"    Currently in Pain?  No/denies    Pain Score  0-No pain                       [facility] Adult PT Treatment/Exercise - 09/25/17 0001      Shoulder Exercises: Seated   Other Seated Exercises  serratus pressus 35lb 2x10      Shoulder Exercises: Standing   Horizontal ABduction  Right;Theraband;15 reps;10 reps    Theraband Level (Shoulder Horizontal ABduction)  Level 3 (Green)    Other Standing Exercises  4 way scap stab x10 each 2lb; standing rev grip rows 45lb 2x10     Other Standing Exercises  drop and catch flex and abd 2x20      Shoulder Exercises: ROM/Strengthening   UBE (Upper Arm Bike)  constant work 40watts x 5 minutes    Other ROM/Strengthening Exercises  RUE ER abd 90      Shoulder Exercises: Power Control and instrumentation engineer and lats 45lb 2x10       Shoulder Exercises: Body Blade   Flexion  30 seconds;2 reps    External Rotation  30 seconds;2 reps      Modalities   Modalities  Moist Heat;Electrical Stimulation      Moist Heat Therapy   Number Minutes Moist Heat  15 Minutes    Moist Heat Location  Shoulder      Electrical Stimulation   Electrical Stimulation Location  right scapular area    Electrical Stimulation Action  IFC    Electrical Stimulation Parameters  supine    Electrical Stimulation Goals  Pain               PT Short Term Goals - 09/18/17 1423      PT SHORT TERM GOAL #1   Title  independent with initial HEP    Status  Achieved        PT Long Term Goals - 09/23/17 1426      PT LONG TERM GOAL #1   Title  decrease pain 50%    Status  Partially Met      PT LONG TERM GOAL #2   Title  increase ROM to WNL's    Status  Partially Met      PT LONG TERM GOAL #3   Title  increase ER strength to 4+/5  Status  On-going      PT LONG TERM GOAL #4   Title  throw without pain    Status  On-going            Plan - 09/25/17 1423    Clinical Impression Statement  Pt reports that he feels like his shoulder is better overall. Some scapular winging noted with seated rows. Reports some popping with lat pull downs. Muscle fatigue and a little pain with ER activities.     Rehab Potential  Good    PT Frequency  2x / week    PT Duration  8 weeks    PT Treatment/Interventions  ADLs/Self Care Home Management;Cryotherapy;Electrical Stimulation;Moist Heat;Iontophoresis 75m/ml Dexamethasone;Neuromuscular re-education;Therapeutic exercise;Therapeutic activities;Patient/family education;Manual techniques;Dry needling;Vasopneumatic Device    PT Next Visit Plan  may return to dry needling of supra and infraspinatus, Scapular stabilizarion       Patient will benefit from skilled therapeutic intervention in order to improve the following deficits and impairments:  Increased fascial restricitons, Decreased range of motion, Increased muscle spasms, Pain, Hypermobility, Impaired flexibility, Improper body mechanics, Postural dysfunction,  Decreased strength, Increased edema  Visit Diagnosis: Other muscle spasm  Acute pain of right shoulder  Stiffness of right shoulder, not elsewhere classified  Localized edema     Problem List Patient Active Problem List   Diagnosis Date Noted  . ADHD (attention deficit hyperactivity disorder), inattentive type 09/15/2015    RScot Jun PTA 09/25/2017, 2:25 PM  [place] [address]Phone: [phone]  Fax:  [phone] Name: NDillian FeigMRN: [id number] of Birth: 813-Jan-1997

## 2017-09-30 ENCOUNTER — Ambulatory Visit: Payer: 59 | Admitting: Physical Therapy

## 2017-10-02 ENCOUNTER — Ambulatory Visit: Payer: 59 | Admitting: Physical Therapy

## 2017-10-07 ENCOUNTER — Ambulatory Visit: Payer: 59 | Admitting: Physical Therapy

## 2017-10-07 ENCOUNTER — Encounter: Payer: Self-pay | Admitting: Physical Therapy

## 2017-10-07 DIAGNOSIS — M62838 Other muscle spasm: Secondary | ICD-10-CM

## 2017-10-07 DIAGNOSIS — M25511 Pain in right shoulder: Secondary | ICD-10-CM | POA: Diagnosis not present

## 2017-10-07 DIAGNOSIS — R600 Localized edema: Secondary | ICD-10-CM

## 2017-10-07 DIAGNOSIS — M25611 Stiffness of right shoulder, not elsewhere classified: Secondary | ICD-10-CM

## 2017-10-07 NOTE — Therapy (Signed)
[address] [place], [place], [address] Phone: [phone]   Fax:  [phone]  Physical Therapy Treatment  Patient Details  Name: Derrick Reeves MRN: [id number] Date of Birth: 02-24-1996 Referring Provider: Dr. Alysia Penna  at [facility] sports medicine, [place] Matte   Encounter Date: 10/07/2017  PT End of Session - 10/07/17 1426    Visit Number  7    Date for PT Re-Evaluation  11/08/17    PT Start Time  1345    PT Stop Time  1439    PT Time Calculation (min)  54 min    Activity Tolerance  Patient tolerated treatment well    Behavior During Therapy  [facility] for tasks assessed/performed       Past Medical History:  Diagnosis Date  . ADHD (attention deficit hyperactivity disorder)     Past Surgical History:  Procedure Laterality Date  . TYMPANOSTOMY TUBE PLACEMENT      There were no vitals filed for this visit.  Subjective Assessment - 10/07/17 1344    Subjective  Pt stated that he is doing good. He threw a tennis ball and has some discomfort around his R scapula.     Currently in Pain?  No/denies    Pain Score  0-No pain                       [facility] Adult PT Treatment/Exercise - 10/07/17 0001      Shoulder Exercises: Seated   Other Seated Exercises  4lb ext 2x10    Other Seated Exercises  serratus pressus 35lb 2x10      Shoulder Exercises: Prone   Other Prone Exercises  quadruped horiz abd with green RUE x10       Shoulder Exercises: Standing   Horizontal ABduction  10 reps x2    Theraband Level (Shoulder Horizontal ABduction)  Level 1 (Yellow)    Other Standing Exercises  4 way scap stab x10 each 2lb;    Other Standing Exercises  Barbell shruggs 70lb 2x10; drop and catch red ball fles & abd 2x10       Shoulder Exercises: ROM/Strengthening   UBE (Upper Arm Bike)  constant work 40watts x 5 minutes      Shoulder Exercises: IT sales professional  4 reps;20 seconds    Internal Rotation Stretch  20 seconds      Shoulder Exercises: Power Control and instrumentation engineer and lats 45lb x15      Modalities   Modalities  Moist Heat;Electrical Stimulation      Moist Heat Therapy   Number Minutes Moist Heat  15 Minutes    Moist Heat Location  Shoulder      Electrical Stimulation   Electrical Stimulation Location  right scapular area    Electrical Stimulation Action  IFC    Electrical Stimulation Parameters  supine    Electrical Stimulation Goals  Pain               PT Short Term Goals - 09/18/17 1423      PT SHORT TERM GOAL #1   Title  independent with initial HEP    Status  Achieved        PT Long Term Goals - 09/23/17 1426      PT LONG TERM GOAL #1   Title  decrease pain 50%    Status  Partially Met      PT  LONG TERM GOAL #2   Title  increase ROM to WNL's    Status  Partially Met      PT LONG TERM GOAL #3   Title  increase ER strength to 4+/5    Status  On-going      PT LONG TERM GOAL #4   Title  throw without pain    Status  On-going            Plan - 10/07/17 1426    Clinical Impression Statement  Pt able to do all of today's interventions, R scapula does pop with seated rows and horizontal abd. R scapula does pop on the eccentric phase of retraction. Muscle fatigue quick with ER activities.     Rehab Potential  Good    PT Frequency  2x / week    PT Duration  8 weeks    PT Treatment/Interventions  ADLs/Self Care Home Management;Cryotherapy;Electrical Stimulation;Moist Heat;Iontophoresis 47m/ml Dexamethasone;Neuromuscular re-education;Therapeutic exercise;Therapeutic activities;Patient/family education;Manual techniques;Dry needling;Vasopneumatic Device    PT Next Visit Plan  may return to dry needling of supra and infraspinatus, Scapular stabilizarion       Patient will benefit from skilled therapeutic intervention in order to improve the following deficits and impairments:  Increased fascial  restricitons, Decreased range of motion, Increased muscle spasms, Pain, Hypermobility, Impaired flexibility, Improper body mechanics, Postural dysfunction, Decreased strength, Increased edema  Visit Diagnosis: Other muscle spasm  Acute pain of right shoulder  Stiffness of right shoulder, not elsewhere classified  Localized edema     Problem List Patient Active Problem List   Diagnosis Date Noted  . ADHD (attention deficit hyperactivity disorder), inattentive type 09/15/2015    RScot Jun PTA 10/07/2017, 2:29 PM  [address] [address]Phone: [phone]  Fax:  [phone] Name: Derrick FjeldMRN: [id number] of Birth: [birth year]

## 2017-10-09 ENCOUNTER — Encounter: Payer: Self-pay | Admitting: Physical Therapy

## 2017-10-09 ENCOUNTER — Ambulatory Visit: Payer: 59 | Admitting: Physical Therapy

## 2017-10-09 DIAGNOSIS — M25611 Stiffness of right shoulder, not elsewhere classified: Secondary | ICD-10-CM

## 2017-10-09 DIAGNOSIS — M25511 Pain in right shoulder: Secondary | ICD-10-CM

## 2017-10-09 DIAGNOSIS — M62838 Other muscle spasm: Secondary | ICD-10-CM

## 2017-10-09 DIAGNOSIS — R600 Localized edema: Secondary | ICD-10-CM

## 2017-10-09 NOTE — Therapy (Signed)
[address] [place], [place], [address] Phone: [phone]   Fax:  [phone]  Physical Therapy Treatment  Patient Details  Name: Derrick Reeves MRN: [id number] Date of Birth: 09-06-1995 Referring Provider: Dr. Alysia Penna  at [facility] sports medicine, [place] Matte   Encounter Date: 10/09/2017  PT End of Session - 10/09/17 1427    Visit Number  8    Date for PT Re-Evaluation  11/08/17    PT Start Time  1345    PT Stop Time  1442    PT Time Calculation (min)  57 min    Activity Tolerance  Patient tolerated treatment well    Behavior During Therapy  [facility] for tasks assessed/performed       Past Medical History:  Diagnosis Date  . ADHD (attention deficit hyperactivity disorder)     Past Surgical History:  Procedure Laterality Date  . TYMPANOSTOMY TUBE PLACEMENT      There were no vitals filed for this visit.  Subjective Assessment - 10/09/17 1348    Subjective  "Same old, no pain now"    Currently in Pain?  No/denies    Pain Score  0-No pain         [facility] PT Assessment - 10/09/17 0001      AROM   Overall AROM   Within functional limits for tasks performed    AROM Assessment Site  Shoulder    Right/Left Shoulder  Right                   [facility] Adult PT Treatment/Exercise - 10/09/17 0001      Shoulder Exercises: Prone   Other Prone Exercises  [place]oughston's 5#-2#, plank to push ups 2x10     Other Prone Exercises  quadruped horiz abd with green RUE x10       Shoulder Exercises: Standing   Other Standing Exercises  Drop and catch with ring 2x10; RUE D2 flexion red 2x10     Other Standing Exercises  triceps ext 55lb 2x15; bicep curls 35lb 2x15       Shoulder Exercises: ROM/Strengthening   UBE (Upper Arm Bike)  constant work 40watts x 6 minutes      Shoulder Exercises: IT sales professional  4 reps;20 seconds      Shoulder Exercises: Power Location manager and lats 45lb 2x10     Other Power Scientist, research (medical) presses 35lb 2x15      Shoulder Exercises: Body Blade   Flexion  1 rep;30 seconds    ABduction  30 seconds;1 rep    External Rotation  1 rep;30 seconds    Internal Rotation  30 seconds;1 rep      Modalities   Modalities  Moist Heat;Electrical Stimulation      Moist Heat Therapy   Number Minutes Moist Heat  15 Minutes    Moist Heat Location  Shoulder      Electrical Stimulation   Electrical Stimulation Location  right scapular area    Electrical Stimulation Action  IFC    Electrical Stimulation Parameters  sitting    Electrical Stimulation Goals  Pain               PT Short Term Goals - 09/18/17 1423      PT SHORT TERM GOAL #1   Title  independent with initial HEP    Status  Achieved  PT Long Term Goals - 09/23/17 1426      PT LONG TERM GOAL #1   Title  decrease pain 50%    Status  Partially Met      PT LONG TERM GOAL #2   Title  increase ROM to WNL's    Status  Partially Met      PT LONG TERM GOAL #3   Title  increase ER strength to 4+/5    Status  On-going      PT LONG TERM GOAL #4   Title  throw without pain    Status  On-going            Plan - 10/09/17 1427    Clinical Impression Statement  Pt has full R ahoulder ROM. He does well with all exercises. He does reports pain when he has to control the R scapula going forward. Some bilat scapular winging noted. Pt has some scapular popping with seated rows during the eccentric phase.     Rehab Potential  Good    PT Frequency  2x / week    PT Duration  8 weeks    PT Treatment/Interventions  ADLs/Self Care Home Management;Cryotherapy;Electrical Stimulation;Moist Heat;Iontophoresis 61m/ml Dexamethasone;Neuromuscular re-education;Therapeutic exercise;Therapeutic activities;Patient/family education;Manual techniques;Dry needling;Vasopneumatic Device    PT Next Visit Plan  may return to dry needling of supra and infraspinatus,  Scapular stabilizarion       Patient will benefit from skilled therapeutic intervention in order to improve the following deficits and impairments:  Increased fascial restricitons, Decreased range of motion, Increased muscle spasms, Pain, Hypermobility, Impaired flexibility, Improper body mechanics, Postural dysfunction, Decreased strength, Increased edema  Visit Diagnosis: Acute pain of right shoulder  Other muscle spasm  Stiffness of right shoulder, not elsewhere classified  Localized edema     Problem List Patient Active Problem List   Diagnosis Date Noted  . ADHD (attention deficit hyperactivity disorder), inattentive type 09/15/2015    RScot Jun6/20/2019, 2:30 PM  [address] [address]Phone: [phone]  Fax:  [phone] Name: Derrick LaabsMRN: [id number] of Birth: 8May 18, 1997

## 2017-10-20 ENCOUNTER — Ambulatory Visit: Payer: 59 | Attending: Family Medicine | Admitting: Physical Therapy

## 2017-10-20 ENCOUNTER — Encounter: Payer: Self-pay | Admitting: Physical Therapy

## 2017-10-20 DIAGNOSIS — M62838 Other muscle spasm: Secondary | ICD-10-CM | POA: Insufficient documentation

## 2017-10-20 DIAGNOSIS — M25611 Stiffness of right shoulder, not elsewhere classified: Secondary | ICD-10-CM | POA: Diagnosis present

## 2017-10-20 DIAGNOSIS — M25511 Pain in right shoulder: Secondary | ICD-10-CM | POA: Diagnosis present

## 2017-10-20 NOTE — Therapy (Signed)
[address] [place], [place], [address] Phone: [phone]   Fax:  [phone]  Physical Therapy Treatment  Patient Details  Name: Derrick Reeves MRN: [id number] Date of Birth: 11/02/1995 Referring Provider: Dr. Alysia Penna  at [facility] sports medicine, [place] Matte   Encounter Date: 10/20/2017  PT End of Session - 10/20/17 1558    Visit Number  9    Date for PT Re-Evaluation  11/08/17    PT Start Time  5885    PT Stop Time  1613    PT Time Calculation (min)  58 min    Activity Tolerance  Patient tolerated treatment well    Behavior During Therapy  [facility] for tasks assessed/performed       Past Medical History:  Diagnosis Date  . ADHD (attention deficit hyperactivity disorder)     Past Surgical History:  Procedure Laterality Date  . TYMPANOSTOMY TUBE PLACEMENT      There were no vitals filed for this visit.  Subjective Assessment - 10/20/17 1516    Subjective  Pt reports that he tried throwing last week, he reports that's it is better but hurts more when he throws from lower arm slot.    Currently in Pain?  No/denies                       [facility] Adult PT Treatment/Exercise - 10/20/17 0001      Shoulder Exercises: Prone   Other Prone Exercises  elbow push ups 2x10      Shoulder Exercises: Standing   Extension  Both;10 reps;Theraband x2    Theraband Level (Shoulder Extension)  Level 2 (Red)    Row  Theraband;10 reps;Both x2 With ER    Theraband Level (Shoulder Row)  Level 3 (Green)    Other Standing Exercises  Drop and catch with ring 2x10; RUE D2 flexion red 2x10     Other Standing Exercises  triceps ext 55lb 2x15; bicep curls 35lb 2x15       Shoulder Exercises: ROM/Strengthening   UBE (Upper Arm Bike)  constant work 40watts x 6 minutes      Shoulder Exercises: Stretch   Other Shoulder Stretches  Towel IR stretch 4 x15 sec      Shoulder Exercises: Power Presenter, broadcasting and lats 45lb 2x10     Other Power Scientist, research (medical) presses 35lb 2x15      Shoulder Exercises: Body Blade   Flexion  1 rep;30 seconds    ABduction  30 seconds;1 rep    External Rotation  1 rep;30 seconds    Internal Rotation  30 seconds;1 rep      Modalities   Modalities  Moist Heat;Electrical Stimulation      Moist Heat Therapy   Number Minutes Moist Heat  15 Minutes    Moist Heat Location  Shoulder      Electrical Stimulation   Electrical Stimulation Location  right scapular area    Electrical Stimulation Action  IFC    Electrical Stimulation Parameters  sitting    Electrical Stimulation Goals  Pain               PT Short Term Goals - 09/18/17 1423      PT SHORT TERM GOAL #1   Title  independent with initial HEP    Status  Achieved        PT Long Term Goals -  09/23/17 1426      PT LONG TERM GOAL #1   Title  decrease pain 50%    Status  Partially Met      PT LONG TERM GOAL #2   Title  increase ROM to WNL's    Status  Partially Met      PT LONG TERM GOAL #3   Title  increase ER strength to 4+/5    Status  On-going      PT LONG TERM GOAL #4   Title  throw without pain    Status  On-going            Plan - 10/20/17 1559    Clinical Impression Statement  Pt has some limitations reaching RUE behind back. Some pain reported with towel stretch. Weakness noted with serratus push ups on elbows. Postural cues with standing rows and extensions. R scapula still pops during the eccentric phase during rows.    Rehab Potential  Good    PT Frequency  2x / week    PT Duration  8 weeks    PT Next Visit Plan  may return to dry needling of supra and infraspinatus, Scapular stabilizarion       Patient will benefit from skilled therapeutic intervention in order to improve the following deficits and impairments:  Increased fascial restricitons, Decreased range of motion, Increased muscle spasms, Pain, Hypermobility, Impaired  flexibility, Improper body mechanics, Postural dysfunction, Decreased strength, Increased edema  Visit Diagnosis: Acute pain of right shoulder  Stiffness of right shoulder, not elsewhere classified  Other muscle spasm     Problem List Patient Active Problem List   Diagnosis Date Noted  . ADHD (attention deficit hyperactivity disorder), inattentive type 09/15/2015    Scot Jun, PTA 10/20/2017, 4:01 PM  [address] [place], [place], [address] Phone: [phone]   Fax:  [phone]  Name: Derrick Reeves MRN: [id number] Date of Birth: September 29, 1995

## 2017-10-22 ENCOUNTER — Ambulatory Visit: Payer: 59 | Admitting: Physical Therapy

## 2017-10-28 ENCOUNTER — Encounter: Payer: Self-pay | Admitting: Physical Therapy

## 2017-10-28 ENCOUNTER — Ambulatory Visit: Payer: 59 | Admitting: Physical Therapy

## 2017-10-28 DIAGNOSIS — M62838 Other muscle spasm: Secondary | ICD-10-CM

## 2017-10-28 DIAGNOSIS — M25511 Pain in right shoulder: Secondary | ICD-10-CM | POA: Diagnosis not present

## 2017-10-28 DIAGNOSIS — R600 Localized edema: Secondary | ICD-10-CM

## 2017-10-28 DIAGNOSIS — M25611 Stiffness of right shoulder, not elsewhere classified: Secondary | ICD-10-CM

## 2017-10-28 NOTE — Therapy (Signed)
[place] [address] [place], [place], [address] Phone: [phone]   Fax:  [phone]  Physical Therapy Treatment Progress Note Reporting Period 09/08/17 to 10/28/17 for the past 10 visits  See note below for Objective Data and Assessment of Progress/Goals.      Patient Details  Name: Derrick Reeves MRN: [id number] Date of Birth: 1995/05/07 Referring Provider: Dr. Alysia Penna  at [facility] sports medicine, [place]te   Encounter Date: 10/28/2017  PT End of Session - 10/28/17 1143    Visit Number  10    Date for PT Re-Evaluation  11/08/17    PT Start Time  1100    PT Stop Time  1156    PT Time Calculation (min)  56 min    Activity Tolerance  Patient tolerated treatment well    Behavior During Therapy  [facility] for tasks assessed/performed       Past Medical History:  Diagnosis Date  . ADHD (attention deficit hyperactivity disorder)     Past Surgical History:  Procedure Laterality Date  . TYMPANOSTOMY TUBE PLACEMENT      There were no vitals filed for this visit.  Subjective Assessment - 10/28/17 1104    Subjective  Pt reprots that he is doing good. New HEP is good no pain.     Currently in Pain?  No/denies    Pain Score  0-No pain                       [facility] Adult PT Treatment/Exercise - 10/28/17 0001      Shoulder Exercises: Prone   Other Prone Exercises  elbow push ups 2x10      Shoulder Exercises: Standing   External Rotation  Weights;Right;20 reps abd 90    Extension  15 reps;Theraband x2, ER    Theraband Level (Shoulder Extension)  Level 2 (Red)    Other Standing Exercises  Drop and catch with ring 2x10; RUE D2 flexion red 2x10, pull up position scapular downward rotation x10     Other Standing Exercises  triceps ext 55lb 2x15; bicep curls 35lb 2x15       Shoulder Exercises: ROM/Strengthening   UBE (Upper Arm Bike)  constant work 40watts x 6 minutes      Shoulder Exercises:  Stretch   Other Shoulder Stretches  Towel IR stretch 4 x20 sec      Shoulder Exercises: Power Control and instrumentation engineer and lats 55lb 2x10     Other Power Scientist, research (medical) presses 35lb 2x15      Modalities   Modalities  Moist Heat;Electrical Stimulation      Moist Heat Therapy   Number Minutes Moist Heat  15 Minutes    Moist Heat Location  Shoulder      Electrical Stimulation   Electrical Stimulation Location  right scapular area    Electrical Stimulation Action  IFC    Electrical Stimulation Parameters  Sitting    Electrical Stimulation Goals  Pain               PT Short Term Goals - 10/28/17 1149      PT SHORT TERM GOAL #1   Title  independent with initial HEP    Status  Achieved        PT Long Term Goals - 10/28/17 1149      PT LONG TERM GOAL #1   Title  decrease pain 50%  Status  Partially Met      PT LONG TERM GOAL #2   Title  increase ROM to WNL's    Status  Achieved      PT LONG TERM GOAL #3   Title  increase ER strength to 4+/5    Status  On-going      PT LONG TERM GOAL #4   Title  throw without pain    Status  On-going            Plan - 10/28/17 1145    Clinical Impression Statement  Pt reprots thats he is doing better overall. Still habe some R scapula popping during the eccentric phase of rows. Some discomfort reproteds with elbow pushups and R UE ER.     Rehab Potential  Good    PT Frequency  2x / week    PT Duration  8 weeks    PT Treatment/Interventions  ADLs/Self Care Home Management;Cryotherapy;Electrical Stimulation;Moist Heat;Iontophoresis 23m/ml Dexamethasone;Neuromuscular re-education;Therapeutic exercise;Therapeutic activities;Patient/family education;Manual techniques;Dry needling;Vasopneumatic Device    PT Next Visit Plan  may return to dry needling of supra and infraspinatus, Scapular stabilizarion       Patient will benefit from skilled therapeutic intervention in order to improve the  following deficits and impairments:  Increased fascial restricitons, Decreased range of motion, Increased muscle spasms, Pain, Hypermobility, Impaired flexibility, Improper body mechanics, Postural dysfunction, Decreased strength, Increased edema  Visit Diagnosis: Acute pain of right shoulder  Stiffness of right shoulder, not elsewhere classified  Other muscle spasm  Localized edema     Problem List Patient Active Problem List   Diagnosis Date Noted  . ADHD (attention deficit hyperactivity disorder), inattentive type 09/15/2015    ASumner Boast7/12/2017, 1:00 PM  [address] [address]Phone: [phone]  Fax:  [phone] Name: Derrick NikolaiMRN: [id number] of Birth: 802-04-1995

## 2017-10-30 ENCOUNTER — Ambulatory Visit: Payer: 59 | Admitting: Physical Therapy

## 2017-10-30 ENCOUNTER — Encounter: Payer: Self-pay | Admitting: Physical Therapy

## 2017-10-30 DIAGNOSIS — M25611 Stiffness of right shoulder, not elsewhere classified: Secondary | ICD-10-CM

## 2017-10-30 DIAGNOSIS — R600 Localized edema: Secondary | ICD-10-CM

## 2017-10-30 DIAGNOSIS — M25511 Pain in right shoulder: Secondary | ICD-10-CM | POA: Diagnosis not present

## 2017-10-30 DIAGNOSIS — M62838 Other muscle spasm: Secondary | ICD-10-CM

## 2017-10-30 NOTE — Therapy (Signed)
[address] [address] [place], [place], [address] Phone: [phone]   Fax:  [phone]  Physical Therapy Treatment  Patient Details  Name: Derrick Reeves MRN: [id number] Date of Birth: May 25, 1995 Referring Provider: [facility] Alysia Penna  [facility], [place] Matte   Encounter Date: 10/30/2017  PT End of Session - 10/30/17 1138    Visit Number  11    Date for PT Re-Evaluation  11/08/17    PT Start Time  1100    PT Stop Time  1155    PT Time Calculation (min)  55 min    Activity Tolerance  Patient tolerated treatment well    Behavior During Therapy  [facility] for tasks assessed/performed       Past Medical History:  Diagnosis Date  . ADHD (attention deficit hyperactivity disorder)     Past Surgical History:  Procedure Laterality Date  . TYMPANOSTOMY TUBE PLACEMENT      There were no vitals filed for this visit.  Subjective Assessment - 10/30/17 1102    Subjective  "Good" "I am just tired"    Currently in Pain?  No/denies    Pain Score  0-No pain                       [facility] Adult PT Treatment/Exercise - 10/30/17 0001      Shoulder Exercises: Prone   Other Prone Exercises  15lb horiz abd RUE 2x10 in quadroped       Shoulder Exercises: Standing   External Rotation  Weights;Right;20 reps abd 90     Extension  15 reps;Theraband ER at end     Theraband Level (Shoulder Extension)  Level 2 (Red)    Other Standing Exercises  15lb 3x10 RUE kick backs, Flex & abd 5lb 2x10       Shoulder Exercises: ROM/Strengthening   UBE (Upper Arm Bike)  L2 RUE x2 min fwd/2rev 30 sec rest between each miin    Other ROM/Strengthening Exercises  RUE D2 flex red 2x15      Shoulder Exercises: Power Control and instrumentation engineer and lats 25lb 2x20     Other Power Scientist, research (medical) presses 25lb 2x20      Modalities   Modalities  Moist Heat;Electrical Stimulation      Moist Heat  Therapy   Number Minutes Moist Heat  15 Minutes    Moist Heat Location  Shoulder      Electrical Stimulation   Electrical Stimulation Location  right scapular area    Electrical Stimulation Action  IFC    Electrical Stimulation Parameters  sitting    Electrical Stimulation Goals  Pain               PT Short Term Goals - 10/28/17 1149      PT SHORT TERM GOAL #1   Title  independent with initial HEP    Status  Achieved        PT Long Term Goals - 10/30/17 1139      PT LONG TERM GOAL #1   Title  decrease pain 50%    Status  Achieved      PT LONG TERM GOAL #2   Title  increase ROM to WNL's    Status  Achieved      PT LONG TERM GOAL #3   Title  increase ER strength to 4+/5    Status  Partially Met  PT LONG TERM GOAL #4   Title  throw without pain    Status  On-going            Plan - 10/30/17 1139    Clinical Impression Statement  Pt RUE fatigues quick with single arm on UBE. Reports some pain with resisted ER with RUE abducted to 90 deg. Less popping noted with rows. weakness noted with horiz abd with RUE.    Rehab Potential  Good    PT Next Visit Plan  may return to dry needling of supra and infraspinatus, Scapular stabilization       Patient will benefit from skilled therapeutic intervention in order to improve the following deficits and impairments:  Increased fascial restricitons, Decreased range of motion, Increased muscle spasms, Pain, Hypermobility, Impaired flexibility, Improper body mechanics, Postural dysfunction, Decreased strength, Increased edema  Visit Diagnosis: Acute pain of right shoulder  Stiffness of right shoulder, not elsewhere classified  Other muscle spasm  Localized edema     Problem List Patient Active Problem List   Diagnosis Date Noted  . ADHD (attention deficit hyperactivity disorder), inattentive type 09/15/2015    Scot Jun, PTA 10/30/2017, 11:40 AM  [place], [place], [address] Phone: [phone]   Fax:  [phone]  Name: Derrick Reeves MRN: [id number] Date of Birth: 07/10/1995

## 2017-11-04 ENCOUNTER — Encounter: Payer: Self-pay | Admitting: Physical Therapy

## 2017-11-04 ENCOUNTER — Ambulatory Visit: Payer: 59 | Admitting: Physical Therapy

## 2017-11-04 DIAGNOSIS — M62838 Other muscle spasm: Secondary | ICD-10-CM

## 2017-11-04 DIAGNOSIS — M25511 Pain in right shoulder: Secondary | ICD-10-CM | POA: Diagnosis not present

## 2017-11-04 DIAGNOSIS — M25611 Stiffness of right shoulder, not elsewhere classified: Secondary | ICD-10-CM

## 2017-11-04 DIAGNOSIS — R600 Localized edema: Secondary | ICD-10-CM

## 2017-11-04 NOTE — Therapy (Signed)
[place], [place], [address] Phone: [phone]   Fax:  [phone]  Physical Therapy Treatment  Patient Details  Name: Derrick Reeves MRN: [id number] Date of Birth: October 01, 1995 Referring Provider: Dr. Alysia Penna  at [facility] sports medicine, Mardelle Matte   Encounter Date: 11/04/2017  PT End of Session - 11/04/17 1144    Visit Number  12    Date for PT Re-Evaluation  11/08/17    PT Start Time  1100    PT Stop Time  1159    PT Time Calculation (min)  59 min    Activity Tolerance  Patient tolerated treatment well    Behavior During Therapy  [facility] for tasks assessed/performed       Past Medical History:  Diagnosis Date  . ADHD (attention deficit hyperactivity disorder)     Past Surgical History:  Procedure Laterality Date  . TYMPANOSTOMY TUBE PLACEMENT      There were no vitals filed for this visit.  Subjective Assessment - 11/04/17 1109    Subjective  "All right"    Currently in Pain?  No/denies    Pain Score  0-No pain                       [facility] Adult PT Treatment/Exercise - 11/04/17 0001      Shoulder Exercises: Seated   Other Seated Exercises  High to low rows 115 2x10     Other Seated Exercises  kneeling ER RUE adb 90      Shoulder Exercises: Prone   Other Prone Exercises  elbow push ups x20 x10       Shoulder Exercises: Standing   External Rotation  Weights;Right;20 reps abd 90deg    External Rotation Weight (lbs)  5# pulleys    Other Standing Exercises  Drop and catch     Other Standing Exercises  RUE flex and abd 2x10; Horiz abd 10lb 2x109       Shoulder Exercises: ROM/Strengthening   UBE (Upper Arm Bike)  RUE constant work 10 watts x 4 min, 2 min constant work bilat    Other ROM/Strengthening Exercises  RUE D2 flex red 2x15      Shoulder Exercises: Power Control and instrumentation engineer and lats 55lb 2x15     Other Power Special educational needs teacher presses 45lb 2x15      Modalities   Modalities  Moist Heat;Electrical Stimulation      Moist Heat Therapy   Number Minutes Moist Heat  15 Minutes    Moist Heat Location  Shoulder      Electrical Stimulation   Electrical Stimulation Location  right scapular area    Electrical Stimulation Action  IFC    Electrical Stimulation Parameters  sitting    Electrical Stimulation Goals  Pain               PT Short Term Goals - 10/28/17 1149      PT SHORT TERM GOAL #1   Title  independent with initial HEP    Status  Achieved        PT Long Term Goals - 11/04/17 1144      PT LONG TERM GOAL #1   Title  decrease pain 50%    Status  Achieved      PT LONG TERM GOAL #2   Title  increase ROM to WNL's    Status  Achieved      PT LONG TERM GOAL #3   Title  increase ER strength to 4+/5    Status  Partially Met      PT LONG TERM GOAL #4   Title  throw without pain    Status  On-going            Plan - 11/04/17 1145    Clinical Impression Statement  Pt reports that he can really tell som improvement, "I can feel it but it is not painful" all exercises today completed with good strength and ROM. Little to no popping reported today with activity.     Rehab Potential  Good    PT Frequency  2x / week    PT Duration  8 weeks    PT Treatment/Interventions  ADLs/Self Care Home Management;Cryotherapy;Electrical Stimulation;Moist Heat;Iontophoresis 63m/ml Dexamethasone;Neuromuscular re-education;Therapeutic exercise;Therapeutic activities;Patient/family education;Manual techniques;Dry needling;Vasopneumatic Device    PT Next Visit Plan  may return to dry needling of supra and infraspinatus, Scapular stabilization       Patient will benefit from skilled therapeutic intervention in order to improve the following deficits and impairments:  Increased fascial restricitons, Decreased range of motion, Increased muscle spasms, Pain, Hypermobility, Impaired flexibility, Improper  body mechanics, Postural dysfunction, Decreased strength, Increased edema  Visit Diagnosis: Acute pain of right shoulder  Stiffness of right shoulder, not elsewhere classified  Other muscle spasm  Localized edema     Problem List Patient Active Problem List   Diagnosis Date Noted  . ADHD (attention deficit hyperactivity disorder), inattentive type 09/15/2015    RScot Jun PTA 11/04/2017, 11:50 AM  [place] [address]Phone: [phone]  Fax:  [phone] Name: Derrick HurMRN: [id number] of Birth: 804-Mar-1997

## 2017-11-06 ENCOUNTER — Ambulatory Visit: Payer: 59 | Admitting: Physical Therapy

## 2017-11-11 ENCOUNTER — Ambulatory Visit: Payer: 59 | Admitting: Physical Therapy

## 2017-11-11 ENCOUNTER — Encounter: Payer: Self-pay | Admitting: Physical Therapy

## 2017-11-11 DIAGNOSIS — M25611 Stiffness of right shoulder, not elsewhere classified: Secondary | ICD-10-CM

## 2017-11-11 DIAGNOSIS — R600 Localized edema: Secondary | ICD-10-CM

## 2017-11-11 DIAGNOSIS — M25511 Pain in right shoulder: Secondary | ICD-10-CM | POA: Diagnosis not present

## 2017-11-11 DIAGNOSIS — M62838 Other muscle spasm: Secondary | ICD-10-CM

## 2017-11-11 NOTE — Therapy (Signed)
[place], [place], [address] Phone: [phone]   Fax:  [phone]  Physical Therapy Treatment  Patient Details  Name: Derrick Reeves MRN: [id number] Date of Birth: 1995/06/05 Referring Provider: Dr. Alysia Penna  at [facility] sports medicine, [place]ardelle Matte   Encounter Date: 11/11/2017  PT End of Session - 11/11/17 1142    Visit Number  13    Date for PT Re-Evaluation  11/08/17    PT Start Time  1100    PT Stop Time  1155    PT Time Calculation (min)  55 min    Activity Tolerance  Patient tolerated treatment well    Behavior During Therapy  [facility] for tasks assessed/performed       Past Medical History:  Diagnosis Date  . ADHD (attention deficit hyperactivity disorder)     Past Surgical History:  Procedure Laterality Date  . TYMPANOSTOMY TUBE PLACEMENT      There were no vitals filed for this visit.  Subjective Assessment - 11/11/17 1103    Subjective  "good" Pt reports that's he did throw some, the harder he throws the more he feels it in her scapula    Currently in Pain?  No/denies    Pain Score  0-No pain                       [facility] Adult PT Treatment/Exercise - 11/11/17 0001      Shoulder Exercises: Seated   Other Seated Exercises  kneeling IR green band slow eccentric    Other Seated Exercises  kneeling ER RUE abd 90      Shoulder Exercises: Standing   Row  Strengthening;Right;15 reps;Weights x2    Row Weight (lbs)  35    Other Standing Exercises  RUE D2 flex 2x15     Other Standing Exercises  archers row 20lb 2x15      Shoulder Exercises: ROM/Strengthening   UBE (Upper Arm Bike)  RUE constant work 10 watts x5      Shoulder Exercises: Stretch   Other Shoulder Stretches  Towel IR stretch 4 x20 sec      Shoulder Exercises: Power Control and instrumentation engineer and lats 65lb 2x15     Other Power Scientist, research (medical) presses 45lb 2x15      Modalities   Modalities  Moist Heat;Electrical Stimulation      Moist Heat Therapy   Number Minutes Moist Heat  15 Minutes    Moist Heat Location  Shoulder      Electrical Stimulation   Electrical Stimulation Location  right scapular area    Electrical Stimulation Action  IFC    Electrical Stimulation Parameters  sitting    Electrical Stimulation Goals  Pain               PT Short Term Goals - 10/28/17 1149      PT SHORT TERM GOAL #1   Title  independent with initial HEP    Status  Achieved        PT Long Term Goals - 11/11/17 1146      PT LONG TERM GOAL #1   Title  decrease pain 50%    Status  Achieved      PT LONG TERM GOAL #2   Title  increase ROM to WNL's    Status  Achieved      PT LONG TERM GOAL #3  Title  increase ER strength to 4+/5    Status  Partially Met      PT LONG TERM GOAL #4   Title  throw without pain    Status  On-going            Plan - 11/11/17 1143    Clinical Impression Statement  Pt reports throwing recently and stated some improvement. Just "tossing" he reports no pain but can feel his shoulder when he throws hard but this has improved overall. Pt does have some popping with resisted IN with RUE abducted to 90 deg. All exercises tolerated well, shoulder did fatigue quick with archers row.     Rehab Potential  Good    PT Frequency  2x / week    PT Duration  8 weeks    PT Treatment/Interventions  ADLs/Self Care Home Management;Cryotherapy;Electrical Stimulation;Moist Heat;Iontophoresis 2m/ml Dexamethasone;Neuromuscular re-education;Therapeutic exercise;Therapeutic activities;Patient/family education;Manual techniques;Dry needling;Vasopneumatic Device    PT Next Visit Plan  may return to dry needling of supra and infraspinatus, Scapular stabilizarion       Patient will benefit from skilled therapeutic intervention in order to improve the following deficits and impairments:  Increased fascial restricitons, Decreased range of  motion, Increased muscle spasms, Pain, Hypermobility, Impaired flexibility, Improper body mechanics, Postural dysfunction, Decreased strength, Increased edema  Visit Diagnosis: Acute pain of right shoulder  Stiffness of right shoulder, not elsewhere classified  Other muscle spasm  Localized edema     Problem List Patient Active Problem List   Diagnosis Date Noted  . ADHD (attention deficit hyperactivity disorder), inattentive type 09/15/2015    RScot Jun PTA 11/11/2017, 11:46 AM  [place] [address]Phone: [phone]  Fax:  [phone] Name: Derrick LewinskiMRN: [id number] of Birth: 8January 08, 1997

## 2017-11-13 ENCOUNTER — Ambulatory Visit: Payer: 59 | Admitting: Physical Therapy

## 2017-11-13 ENCOUNTER — Encounter: Payer: Self-pay | Admitting: Physical Therapy

## 2017-11-13 DIAGNOSIS — M25511 Pain in right shoulder: Secondary | ICD-10-CM

## 2017-11-13 DIAGNOSIS — M25611 Stiffness of right shoulder, not elsewhere classified: Secondary | ICD-10-CM

## 2017-11-13 DIAGNOSIS — M62838 Other muscle spasm: Secondary | ICD-10-CM

## 2017-11-13 NOTE — Therapy (Signed)
[place] [address] [address] [place], [place], [address] Phone: [phone]   Fax:  [phone]  Physical Therapy Treatment  Patient Details  Name: Derrick Reeves MRN: [id number] Date of Birth: 01-03-1996 Referring Provider: Dr. Alysia Penna  at [facility] sports medicine, [place] Matte   Encounter Date: 11/13/2017  PT End of Session - 11/13/17 1140    Visit Number  14    Date for PT Re-Evaluation  11/08/17    PT Start Time  1100    PT Stop Time  1155    PT Time Calculation (min)  55 min       Past Medical History:  Diagnosis Date  . ADHD (attention deficit hyperactivity disorder)     Past Surgical History:  Procedure Laterality Date  . TYMPANOSTOMY TUBE PLACEMENT      There were no vitals filed for this visit.  Subjective Assessment - 11/13/17 1104    Subjective  Pt reported that's he Hit a baseball yesterday without issue    Currently in Pain?  No/denies    Pain Score  0-No pain                       [facility] Adult PT Treatment/Exercise - 11/13/17 0001      Shoulder Exercises: Seated   Other Seated Exercises  kneeling IR/er green band slow eccentric    Other Seated Exercises  Kneeling ER flick red ball back       Shoulder Exercises: Standing   Other Standing Exercises  Drop and catch, RUE D2 flex ex10    Other Standing Exercises  archers row 25lb 2x15      Shoulder Exercises: ROM/Strengthening   UBE (Upper Arm Bike)  RUE consant work 10 watts x5 min       Shoulder Exercises: Stretch   Other Shoulder Stretches  Towel IR stretch 4 x20 sec      Shoulder Exercises: Power Control and instrumentation engineer and lats 65lb 2x15     Other Power Scientist, research (medical) presses 45lb 2x15      Modalities   Modalities  Moist Heat;Electrical Stimulation      Moist Heat Therapy   Number Minutes Moist Heat  15 Minutes    Moist Heat Location  Shoulder      Electrical Stimulation    Electrical Stimulation Location  right scapular area    Electrical Stimulation Action  IFC    Electrical Stimulation Parameters  sitting    Electrical Stimulation Goals  Pain               PT Short Term Goals - 10/28/17 1149      PT SHORT TERM GOAL #1   Title  independent with initial HEP    Status  Achieved        PT Long Term Goals - 11/13/17 1140      PT LONG TERM GOAL #1   Title  decrease pain 50%    Status  Achieved      PT LONG TERM GOAL #2   Title  increase ROM to WNL's    Status  Achieved      PT LONG TERM GOAL #3   Title  increase ER strength to 4+/5    Status  Partially Met      PT LONG TERM GOAL #4   Title  throw without pain    Status  On-going  Plan - 11/13/17 1140    Clinical Impression Statement  Pt reports that's he was able to hit a baseball without pain, he continues to do well with good strength and ROM. He reports that he feels his shoulder when throwing more than 50% effort. Fatigues quick with ball drops/catches, internal and external rotation with shoulder abducted to 90. Some popping with resisted internal rotation    Rehab Potential  Good    PT Frequency  2x / week    PT Duration  8 weeks    PT Treatment/Interventions  ADLs/Self Care Home Management;Cryotherapy;Electrical Stimulation;Moist Heat;Iontophoresis 41m/ml Dexamethasone;Neuromuscular re-education;Therapeutic exercise;Therapeutic activities;Patient/family education;Manual techniques;Dry needling;Vasopneumatic Device    PT Next Visit Plan  may return to dry needling of supra and infraspinatus, Scapular stabilizarion       Patient will benefit from skilled therapeutic intervention in order to improve the following deficits and impairments:  Increased fascial restricitons, Decreased range of motion, Increased muscle spasms, Pain, Hypermobility, Impaired flexibility, Improper body mechanics, Postural dysfunction, Decreased strength, Increased edema  Visit  Diagnosis: Acute pain of right shoulder  Stiffness of right shoulder, not elsewhere classified  Other muscle spasm     Problem List Patient Active Problem List   Diagnosis Date Noted  . ADHD (attention deficit hyperactivity disorder), inattentive type 09/15/2015    RScot Jun PTA 11/13/2017, 11:43 AM  [place] [address]Phone: [phone]  Fax:  [phone] Name: Derrick DobbinsMRN: [id number] of Birth: 8October 28, 1997

## 2017-11-18 ENCOUNTER — Ambulatory Visit: Payer: 59 | Admitting: Physical Therapy

## 2017-11-18 ENCOUNTER — Encounter: Payer: Self-pay | Admitting: Physical Therapy

## 2017-11-18 DIAGNOSIS — R600 Localized edema: Secondary | ICD-10-CM

## 2017-11-18 DIAGNOSIS — M25511 Pain in right shoulder: Secondary | ICD-10-CM

## 2017-11-18 DIAGNOSIS — M25611 Stiffness of right shoulder, not elsewhere classified: Secondary | ICD-10-CM

## 2017-11-18 DIAGNOSIS — M62838 Other muscle spasm: Secondary | ICD-10-CM

## 2017-11-18 NOTE — Therapy (Signed)
[place] [address] [place], [place], [address] Phone: [phone]   Fax:  [phone]  Physical Therapy Treatment  Patient Details  Name: Derrick Reeves MRN: [id number] Date of Birth: [birth year] Referring Provider: Dr. Alysia Penna  at [facility] sports medicine, [place] Matte   Encounter Date: 11/18/2017  PT End of Session - 11/18/17 1144    Visit Number  15    Date for PT Re-Evaluation  12/10/17    PT Start Time  1100    PT Stop Time  1154    PT Time Calculation (min)  54 min    Activity Tolerance  Patient tolerated treatment well    Behavior During Therapy  [facility] for tasks assessed/performed       Past Medical History:  Diagnosis Date  . ADHD (attention deficit hyperactivity disorder)     Past Surgical History:  Procedure Laterality Date  . TYMPANOSTOMY TUBE PLACEMENT      There were no vitals filed for this visit.  Subjective Assessment - 11/18/17 1107    Subjective  "Im all right, I threw again yesterday" Pt reports he felt pretty good throwing yesterday, "a few I could feel It but it was not painful "    Currently in Pain?  No/denies    Pain Score  0-No pain                       [facility] Adult PT Treatment/Exercise - 11/18/17 0001      Shoulder Exercises: Seated   Other Seated Exercises  kneeling IR/ER green band slow eccentric    Other Seated Exercises  Kneeling ER flick red ball back       Shoulder Exercises: Prone   Other Prone Exercises  on treadmill 33mh 15'' x 6       Shoulder Exercises: Standing   Other Standing Exercises  tricep pressdows 65lb 3x10    Other Standing Exercises  pully barbell rows 70lb 2x15       Shoulder Exercises: ROM/Strengthening   UBE (Upper Arm Bike)  constant worl 45 watts 3 minn e[address] way     Other ROM/Strengthening Exercises  RUE D2 flex red 2x15      Shoulder Exercises: Stretch   Other Shoulder Stretches  Towel IR stretch 4 x20 sec       Shoulder Exercises: Power TControl and instrumentation engineerand lats 75lb 2x10    Other Power TScientist, research (medical)presses 55lb 3x10      Modalities   Modalities  Moist Heat;Electrical Stimulation      Moist Heat Therapy   Number Minutes Moist Heat  15 Minutes    Moist Heat Location  Shoulder      Electrical Stimulation   Electrical Stimulation Location  right scapular area    Electrical Stimulation Action  IFC    Electrical Stimulation Parameters  sitting    Electrical Stimulation Goals  Pain               PT Short Term Goals - 10/28/17 1149      PT SHORT TERM GOAL #1   Title  independent with initial HEP    Status  Achieved        PT Long Term Goals - 11/13/17 1140      PT LONG TERM GOAL #1   Title  decrease pain 50%    Status  Achieved  PT LONG TERM GOAL #2   Title  increase ROM to WNL's    Status  Achieved      PT LONG TERM GOAL #3   Title  increase ER strength to 4+/5    Status  Partially Met      PT LONG TERM GOAL #4   Title  throw without pain    Status  On-going            Plan - 11/18/17 1146    Clinical Impression Statement  Pt reports throwing yesterday and noticing an improvement. He stated that he can tell he is getting stronger overall. Pt able to complete all of today's activities but with some difficulty with muscle fatigue. Postural cues needed when doing bent  over barbell rows. IN/ER weakness noted with green Tband, needed cues not to compensate.     Rehab Potential  Good    PT Frequency  2x / week    PT Duration  8 weeks    PT Treatment/Interventions  ADLs/Self Care Home Management;Cryotherapy;Electrical Stimulation;Moist Heat;Iontophoresis 74m/ml Dexamethasone;Neuromuscular re-education;Therapeutic exercise;Therapeutic activities;Patient/family education;Manual techniques;Dry needling;Vasopneumatic Device    PT Next Visit Plan  may return to dry needling of supra and infraspinatus, Scapular stabilization        Patient will benefit from skilled therapeutic intervention in order to improve the following deficits and impairments:  Increased fascial restricitons, Decreased range of motion, Increased muscle spasms, Pain, Hypermobility, Impaired flexibility, Improper body mechanics, Postural dysfunction, Decreased strength, Increased edema  Visit Diagnosis: Acute pain of right shoulder  Stiffness of right shoulder, not elsewhere classified  Localized edema  Other muscle spasm     Problem List Patient Active Problem List   Diagnosis Date Noted  . ADHD (attention deficit hyperactivity disorder), inattentive type 09/15/2015    RScot Jun PTA 11/18/2017, 11:49 AM  [place] [address]Phone: [phone]  Fax:  [phone] Name: Derrick KapurMRN: [id number] of Birth: [birth year]

## 2017-11-21 ENCOUNTER — Encounter: Payer: Self-pay | Admitting: Physical Therapy

## 2017-11-24 ENCOUNTER — Encounter: Payer: Self-pay | Admitting: Physical Therapy

## 2017-11-24 ENCOUNTER — Ambulatory Visit: Payer: 59 | Attending: Orthopedic Surgery | Admitting: Physical Therapy

## 2017-11-24 DIAGNOSIS — M62838 Other muscle spasm: Secondary | ICD-10-CM | POA: Diagnosis present

## 2017-11-24 DIAGNOSIS — M25611 Stiffness of right shoulder, not elsewhere classified: Secondary | ICD-10-CM

## 2017-11-24 DIAGNOSIS — R600 Localized edema: Secondary | ICD-10-CM | POA: Diagnosis present

## 2017-11-24 DIAGNOSIS — M25511 Pain in right shoulder: Secondary | ICD-10-CM | POA: Diagnosis present

## 2017-11-24 NOTE — Therapy (Addendum)
[place] [address] [place], [place], [address] Phone: [phone]   Fax:  [phone]  Physical Therapy Treatment  Patient Details  Name: Neno Hohensee MRN: [id number] Date of Birth: [birth year] Referring Provider: Dr. Alysia Penna  at [facility] sports medicine, Mardelle Matte   Encounter Date: 11/24/2017  PT End of Session - 11/24/17 1703    Visit Number  16    Date for PT Re-Evaluation  12/10/17    PT Start Time  1342    PT Stop Time  1440    PT Time Calculation (min)  58 min    Activity Tolerance  Patient tolerated treatment well    Behavior During Therapy  [facility] for tasks assessed/performed       Past Medical History:  Diagnosis Date  . ADHD (attention deficit hyperactivity disorder)     Past Surgical History:  Procedure Laterality Date  . TYMPANOSTOMY TUBE PLACEMENT      There were no vitals filed for this visit.  Subjective Assessment - 11/24/17 1349    Subjective  "Good"    Currently in Pain?  No/denies    Pain Score  0-No pain                       [facility] Adult PT Treatment/Exercise - 11/24/17 0001      Shoulder Exercises: Seated   Other Seated Exercises  High to low rows 115lb 2x15; kneeling ER/IR Blue Tband  RUE abd 90deg    Other Seated Exercises  Kneeling ER flick red ball back       Shoulder Exercises: ROM/Strengthening   UBE (Upper Arm Bike)  RUE consant work 10 watts x4 min     Nustep  L4 No UE x6    Other ROM/Strengthening Exercises  RUE D2 flex green 2x10      Shoulder Exercises: Power Control and instrumentation engineer and lats 75lb 2x15    Other Power Tower Exercises  serratus presses 55lb 2x15      Manual Therapy   Manual Therapy  Soft tissue mobilization    Soft tissue mobilization  right infrapsinatus and rhomboid       Trigger Point Dry Needling - 11/24/17 1702    Consent Given?  Yes    Muscles Treated Upper Body  Infraspinatus    Infraspinatus  Response  Twitch response elicited             PT Short Term Goals - 10/28/17 1149      PT SHORT TERM GOAL #1   Title  independent with initial HEP    Status  Achieved        PT Long Term Goals - 11/13/17 1140      PT LONG TERM GOAL #1   Title  decrease pain 50%    Status  Achieved      PT LONG TERM GOAL #2   Title  increase ROM to WNL's    Status  Achieved      PT LONG TERM GOAL #3   Title  increase ER strength to 4+/5    Status  Partially Met      PT LONG TERM GOAL #4   Title  throw without pain    Status  On-going            Plan - 11/24/17 1423    Clinical Impression Statement  Pt stated that he has not thrown since  last week. Pt tolerated interventions with increase resistance. Reports he could feel a pulling sensation with hight to low rows in R scapula. Good strenght and ROM with all other interventions. Some R scapula popping with ER/IR.     Rehab Potential  Good    PT Frequency  2x / week    PT Duration  8 weeks    PT Next Visit Plan  may return to dry needling of supra and infraspinatus, Scapular stabilizarion       Patient will benefit from skilled therapeutic intervention in order to improve the following deficits and impairments:  Increased fascial restricitons, Decreased range of motion, Increased muscle spasms, Pain, Hypermobility, Impaired flexibility, Improper body mechanics, Postural dysfunction, Decreased strength, Increased edema  Visit Diagnosis: Stiffness of right shoulder, not elsewhere classified  Acute pain of right shoulder  Localized edema  Other muscle spasm     Problem List Patient Active Problem List   Diagnosis Date Noted  . ADHD (attention deficit hyperactivity disorder), inattentive type 09/15/2015   PHYSICAL THERAPY DISCHARGE SUMMARY  Visits from Start of Care: 16 Plan: Patient agrees to discharge.  Patient goals were partially met. Patient is being discharged due to being pleased with the current functional  level.  ?????     Sumner Boast., PT 11/24/2017, 5:05 PM  [place], [place], [address] Phone: [phone]   Fax:  [phone]  Name: Rannie Craney MRN: [id number] Date of Birth: 09-Sep-1995

## 2017-11-26 ENCOUNTER — Encounter: Payer: Self-pay | Admitting: Physical Therapy

## 2020-01-20 ENCOUNTER — Emergency Department (HOSPITAL_BASED_OUTPATIENT_CLINIC_OR_DEPARTMENT_OTHER): Payer: 59

## 2020-01-20 ENCOUNTER — Encounter (HOSPITAL_BASED_OUTPATIENT_CLINIC_OR_DEPARTMENT_OTHER): Payer: Self-pay

## 2020-01-20 ENCOUNTER — Emergency Department (HOSPITAL_BASED_OUTPATIENT_CLINIC_OR_DEPARTMENT_OTHER)
Admission: EM | Admit: 2020-01-20 | Discharge: 2020-01-20 | Disposition: A | Discharge status: Home / Self Care | Payer: 59 | Attending: Emergency Medicine | Admitting: Emergency Medicine

## 2020-01-20 ENCOUNTER — Other Ambulatory Visit: Payer: Self-pay

## 2020-01-20 DIAGNOSIS — R079 Chest pain, unspecified: Secondary | ICD-10-CM | POA: Insufficient documentation

## 2020-01-20 DIAGNOSIS — Z20822 Contact with and (suspected) exposure to covid-19: Secondary | ICD-10-CM | POA: Insufficient documentation

## 2020-01-20 LAB — TROPONIN I (HIGH SENSITIVITY): Troponin I (High Sensitivity): <2 ng/L (ref <18)

## 2020-01-20 LAB — CBC
HCT: 44.9 % (ref 39.0–52.0)
Hemoglobin: 15.3 g/dL (ref 13.0–17.0)
MCH: 30.8 pg (ref 26.0–34.0)
MCHC: 34.1 g/dL (ref 30.0–36.0)
MCV: 90.3 fL (ref 80.0–100.0)
Platelets: 295 10*3/uL (ref 150–400)
RBC: 4.97 MIL/uL (ref 4.22–5.81)
RDW: 12.2 % (ref 11.5–15.5)
WBC: 6.9 10*3/uL (ref 4.0–10.5)
nRBC: 0 % (ref 0.0–0.2)

## 2020-01-20 LAB — BASIC METABOLIC PANEL
Anion gap: 8 (ref 5–15)
BUN: 17 mg/dL (ref 6–20)
CO2: 28 mmol/L (ref 22–32)
Calcium: 9.4 mg/dL (ref 8.9–10.3)
Chloride: 100 mmol/L (ref 98–111)
Creatinine, Ser: 1.09 mg/dL (ref 0.61–1.24)
GFR calc Af Amer: >60 mL/min (ref >60)
GFR calc non Af Amer: >60 mL/min (ref >60)
Glucose, Bld: 78 mg/dL (ref 70–99)
Potassium: 3.8 mmol/L (ref 3.5–5.1)
Sodium: 136 mmol/L (ref 135–145)

## 2020-01-20 LAB — D-DIMER, QUANTITATIVE: D-Dimer, Quant: <0.27 ug/mL-FEU (ref 0.00–0.50)

## 2020-01-20 LAB — RESPIRATORY PANEL BY RT PCR (FLU A&B, COVID)
Influenza A by PCR: NEGATIVE
Influenza B by PCR: NEGATIVE
SARS Coronavirus 2 by RT PCR: NEGATIVE

## 2020-01-20 MED ORDER — KETOROLAC TROMETHAMINE 30 MG/ML IJ SOLN
30.0000 mg | Freq: Once | INTRAMUSCULAR | Status: DC
  Filled 2020-01-20: qty 1

## 2020-01-20 MED ORDER — KETOROLAC TROMETHAMINE 60 MG/2ML IM SOLN: 60.0000 mg | Freq: Once | INTRAMUSCULAR | Status: DC

## 2020-01-20 NOTE — ED Provider Notes (Addendum)
[facility] EMERGENCY DEPARTMENT Provider Note   CSN: [id number] Arrival date & time: 01/20/20  1439     History Chief Complaint  Patient presents with  . Chest Pain    Derrick Reeves is a 24 y.o. male.  HPI 24 year old male with history of ADHD presents to the ER with 2 weeks of chest pain.  States he has worse pain on inspiration.  Denies any injuries, and denies any cough, fevers, chills, shortness of breath, nausea, vomiting, diaphoresis.  He is not vaccinated for Covid.  Denies any nasal congestion, diarrhea.  He denies any recent heavy lifting or inciting incident.  Has some mild exacerbation of the pain with leaning forward and taking deep breaths.  Denies any leg swelling, redness, no recent travel, no recent surgeries.    Past Medical History:  Diagnosis Date  . ADHD (attention deficit hyperactivity disorder)     Patient Active Problem List   Diagnosis Date Noted  . 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 TYMPANOSTOMY TUBE PLACEMENT         Family History  Problem Relation Age of Onset  . Migraines Mother   . Learning disabilities Father   . ADD / ADHD Sister   . Cancer Neg Hx   . Diabetes Neg Hx   . Heart failure Neg Hx     Social History   Tobacco Use  . Smoking status: Never Smoker  . Smokeless tobacco: Never Used  Vaping Use  . Vaping Use: Never used  Substance Use Topics  . Alcohol use: Yes    Comment: occ  . Drug use: No    Home Medications Prior to Admission medications   Medication Sig Start Date End Date Taking? Authorizing Provider  [name] 10 MG TABS Take 10-20 mg by mouth daily. 01/06/17   Lorina Rabon, NP    Allergies    Patient has no known allergies.  Review of Systems   Review of Systems  Constitutional: Negative for chills and fever.  HENT: Negative for ear pain and sore throat.   Eyes: Negative for pain and visual  disturbance.  Respiratory: Negative for cough and shortness of breath.   Cardiovascular: Positive for chest pain. Negative for palpitations.  Gastrointestinal: Negative for abdominal pain and vomiting.  Genitourinary: Negative for dysuria and hematuria.  Musculoskeletal: Negative for arthralgias and back pain.  Skin: Negative for color change and rash.  Neurological: Negative for seizures and syncope.  All other systems reviewed and are negative.   Physical Exam Updated Vital Signs BP 129/82 (BP Location: Right Arm)   Pulse 68   Temp 98.3 F (36.8 C) (Oral)   Resp 18   Ht 6\' 2"  (1.88 m)   Wt 90.7 kg   SpO2 99%   BMI 25.68 kg/m   Physical Exam Vitals and nursing note reviewed.  Constitutional:      General: He is not in acute distress.    Appearance: He is well-developed. He is not ill-appearing, toxic-appearing or diaphoretic.  HENT:     Head: Normocephalic and atraumatic.  Eyes:     Conjunctiva/sclera: Conjunctivae normal.  Cardiovascular:     Rate and Rhythm: Normal rate and regular rhythm.     Heart sounds: Normal heart sounds. Heart sounds not distant. No murmur heard.   Pulmonary:     Effort: Pulmonary effort is normal. No respiratory distress.     Breath sounds: Normal breath  sounds. No decreased breath sounds, wheezing, rhonchi or rales.  Chest:     Chest wall: No tenderness.     Comments: No reproducible chest wall or rib cage tenderness, no step-offs, crepitus, however patient does endorse pain with leaning forward Abdominal:     Palpations: Abdomen is soft.     Tenderness: There is no abdominal tenderness.  Musculoskeletal:     Cervical back: Neck supple.     Right lower leg: No tenderness. No edema.     Left lower leg: No tenderness. No edema.  Skin:    General: Skin is warm and dry.     Findings: No erythema or rash.  Neurological:     General: No focal deficit present.     Mental Status: He is alert.  Psychiatric:        Behavior: Behavior normal.      ED Results / Procedures / Treatments   Labs (all labs ordered are listed, but only abnormal results are displayed) Labs Reviewed  RESPIRATORY PANEL BY RT PCR (FLU A&B, COVID)  CBC  BASIC METABOLIC PANEL  D-DIMER, QUANTITATIVE (NOT AT [facility])  TROPONIN I (HIGH SENSITIVITY)    EKG EKG Interpretation  Date/Time:  Thursday January 20 2020 14:41:23 EDT Ventricular Rate:  86 PR Interval:  130 QRS Duration: 92 QT Interval:  368 QTC Calculation: 440 R Axis:   79 Text Interpretation: Normal sinus rhythm Normal ECG No old tracing to compare Confirmed by Melene Plan [phone]) on 01/20/2020 7:50:28 PM   Radiology DG Chest 2 View  Result Date: 01/20/2020 CLINICAL DATA:  Pt c/o CP x 2 weeks, worse yesterday. Pt states it hurts to breathe. Denies injury. Denies ShOB, N/V, or diaphoresis EXAM: CHEST - 2 VIEW COMPARISON:  None. FINDINGS: The heart size and mediastinal contours are within normal limits. No focal consolidation. No pulmonary edema. No pleural effusion. No pneumothorax. No acute osseous abnormality. IMPRESSION: No active cardiopulmonary disease. Electronically Signed   By: Tish Frederickson M.D.   On: 01/20/2020 15:29    Procedures Procedures (including critical care time)  Medications Ordered in ED Medications  ketorolac (TORADOL) 30 MG/ML injection 30 mg (30 mg Intravenous Not Given 01/20/20 2120)    ED Course  I have reviewed the triage vital signs and the nursing notes.  Pertinent labs & imaging results that were available during my care of the patient were reviewed by me and considered in my medical decision making (see chart for details).    MDM Rules/Calculators/A&P                         89-year-old male with complaints of 2 weeks of chest pain, worsening over the last few days.  On presentation, he is alert, oriented, nontoxic-appearing, no acute distress, speaking full sentences without increased work of breathing, nondiaphoretic.  Vitals overall reassuring.   Physical exam with no lower extremity edema, no reproducible chest wall tenderness, lung sounds clear and heart sounds regular.  His CBC, BMP are without abnormalities.  Covid and flu is negative.  D-dimer is negative.  Troponin is less than 2 with the patient waiting in the ED for over 6 hours.  Chest x-ray without evidence of pneumonia, pericardial effusion to suggest pericarditis, EKG normal sinus rhythm.  Overall work-up reassuring.  Offered the patient some Toradol to treat possible musculoskeletal cause which he refused.  I encouraged the patient to follow-up with his primary care doctor if his symptoms continue.  Return precautions  discussed.  He voiced understanding and is agreeable.  At this stage in the ED course, the patient medically screened and stable for discharge.  Final Clinical Impression(s) / ED Diagnoses Final diagnoses:  Chest pain, unspecified type    Rx / DC Orders ED Discharge Orders    None       Mare Ferrari, PA-C 01/20/20 2123    Mare Ferrari, PA-C 01/20/20 2123    Melene Plan, DO 01/20/20 2248

## 2020-01-20 NOTE — Discharge Instructions (Addendum)
Thank you for letting us take care of you in the ER  Your work-up was overall reassuring.  There was no evidence of heart attack, blood clot in your lungs, infection, etc.  Please continue to take Tylenol or ibuprofen for pain.  Please follow-up with your primary care doctor if your symptoms do not improve.  Return to the ER for any new or worsening symptoms.

## 2020-01-20 NOTE — ED Triage Notes (Signed)
Pt c/o CP x 2 weeks, worse yesterday. Pt states it hurts to breathe. Denies injury. Denies ShOB, N/V, or diaphoresis.

## 2021-12-09 IMAGING — CR DG CHEST 2V
2 series · 2 of 2 positions shown · non-contrast
Comparison: None.

CLINICAL DATA: Pt c/o CP x 2 weeks, worse yesterday. Pt states it
hurts to breathe. Denies injury. Denies ShOB, N/V, or diaphoresis

EXAM:
CHEST - 2 VIEW

[w chest pa]
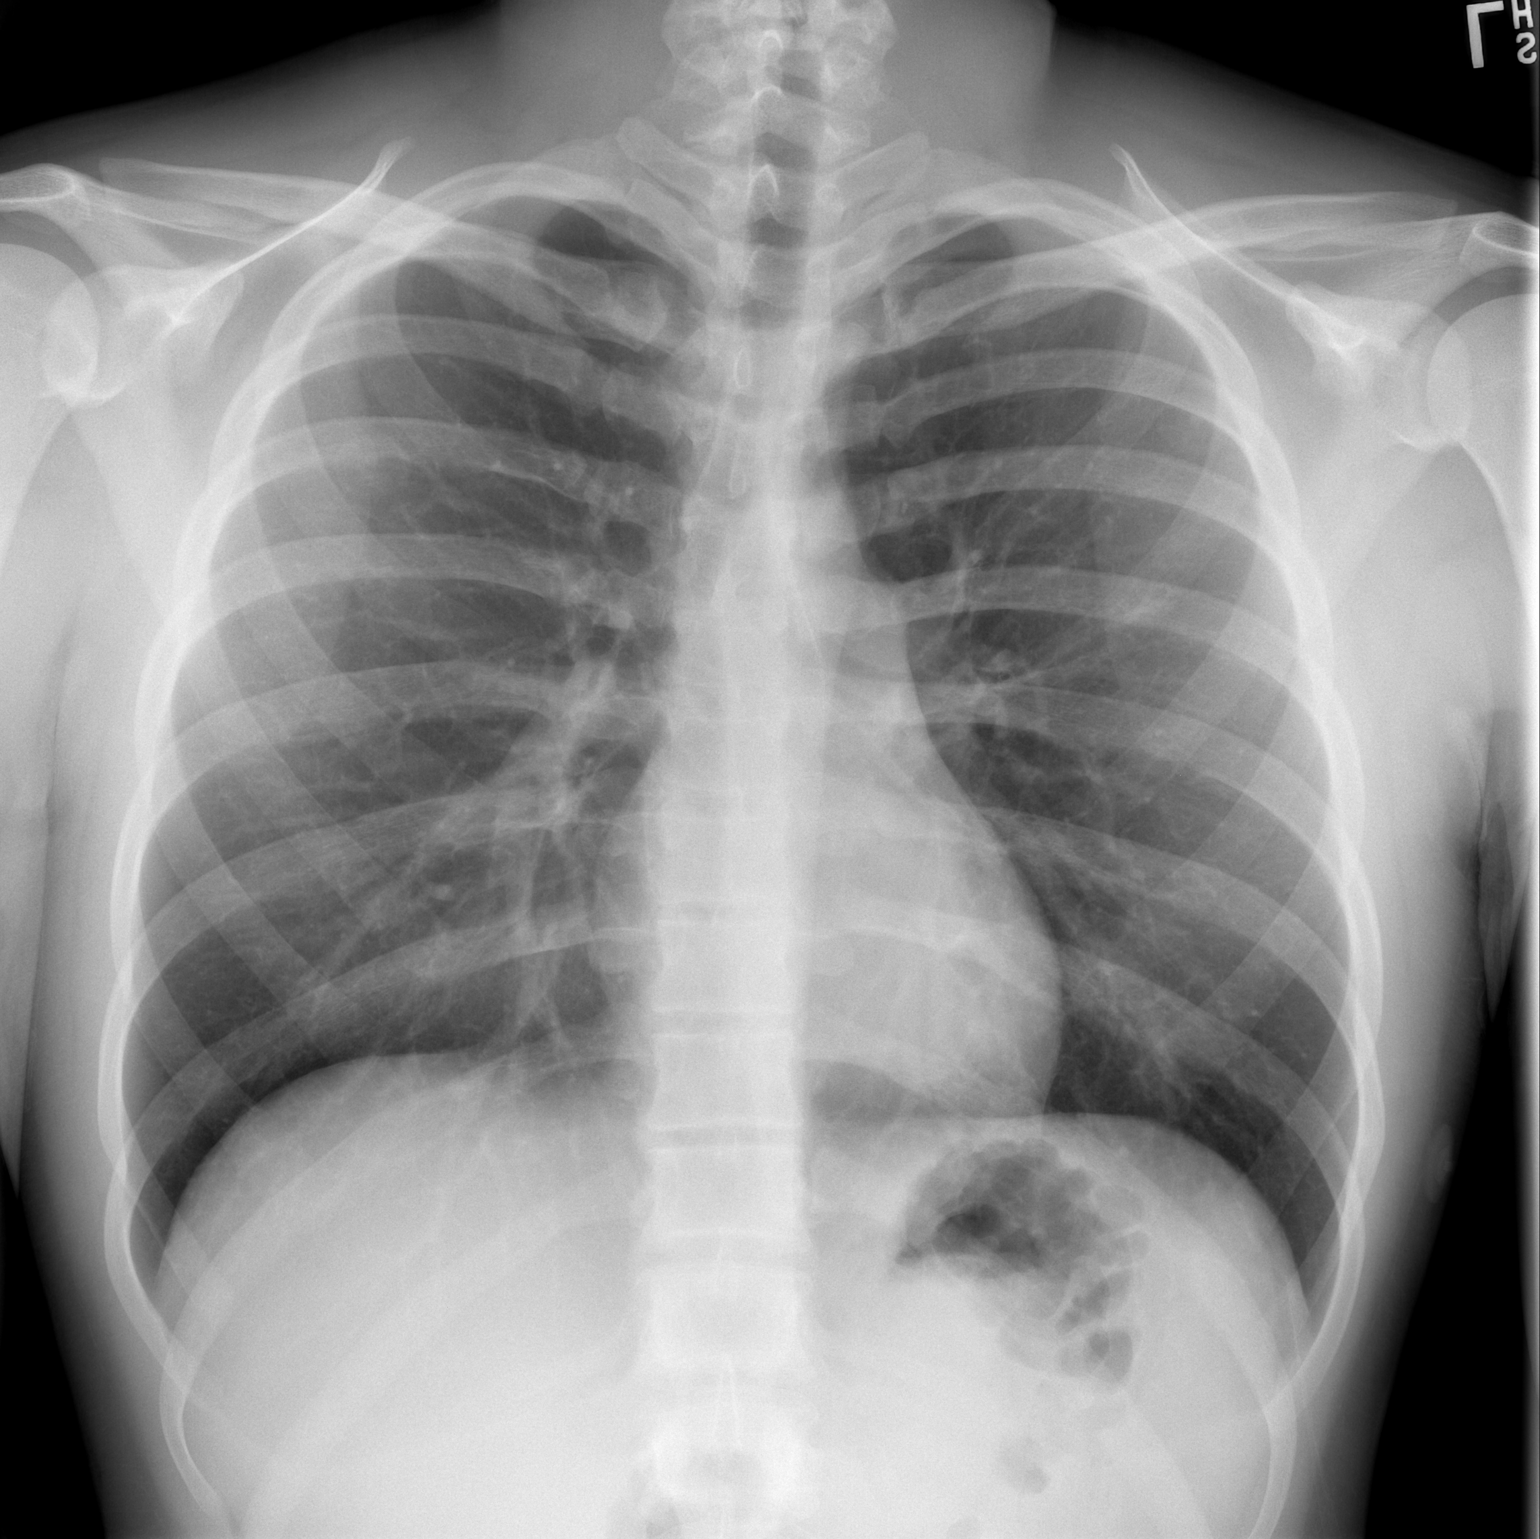

[w chest lat]
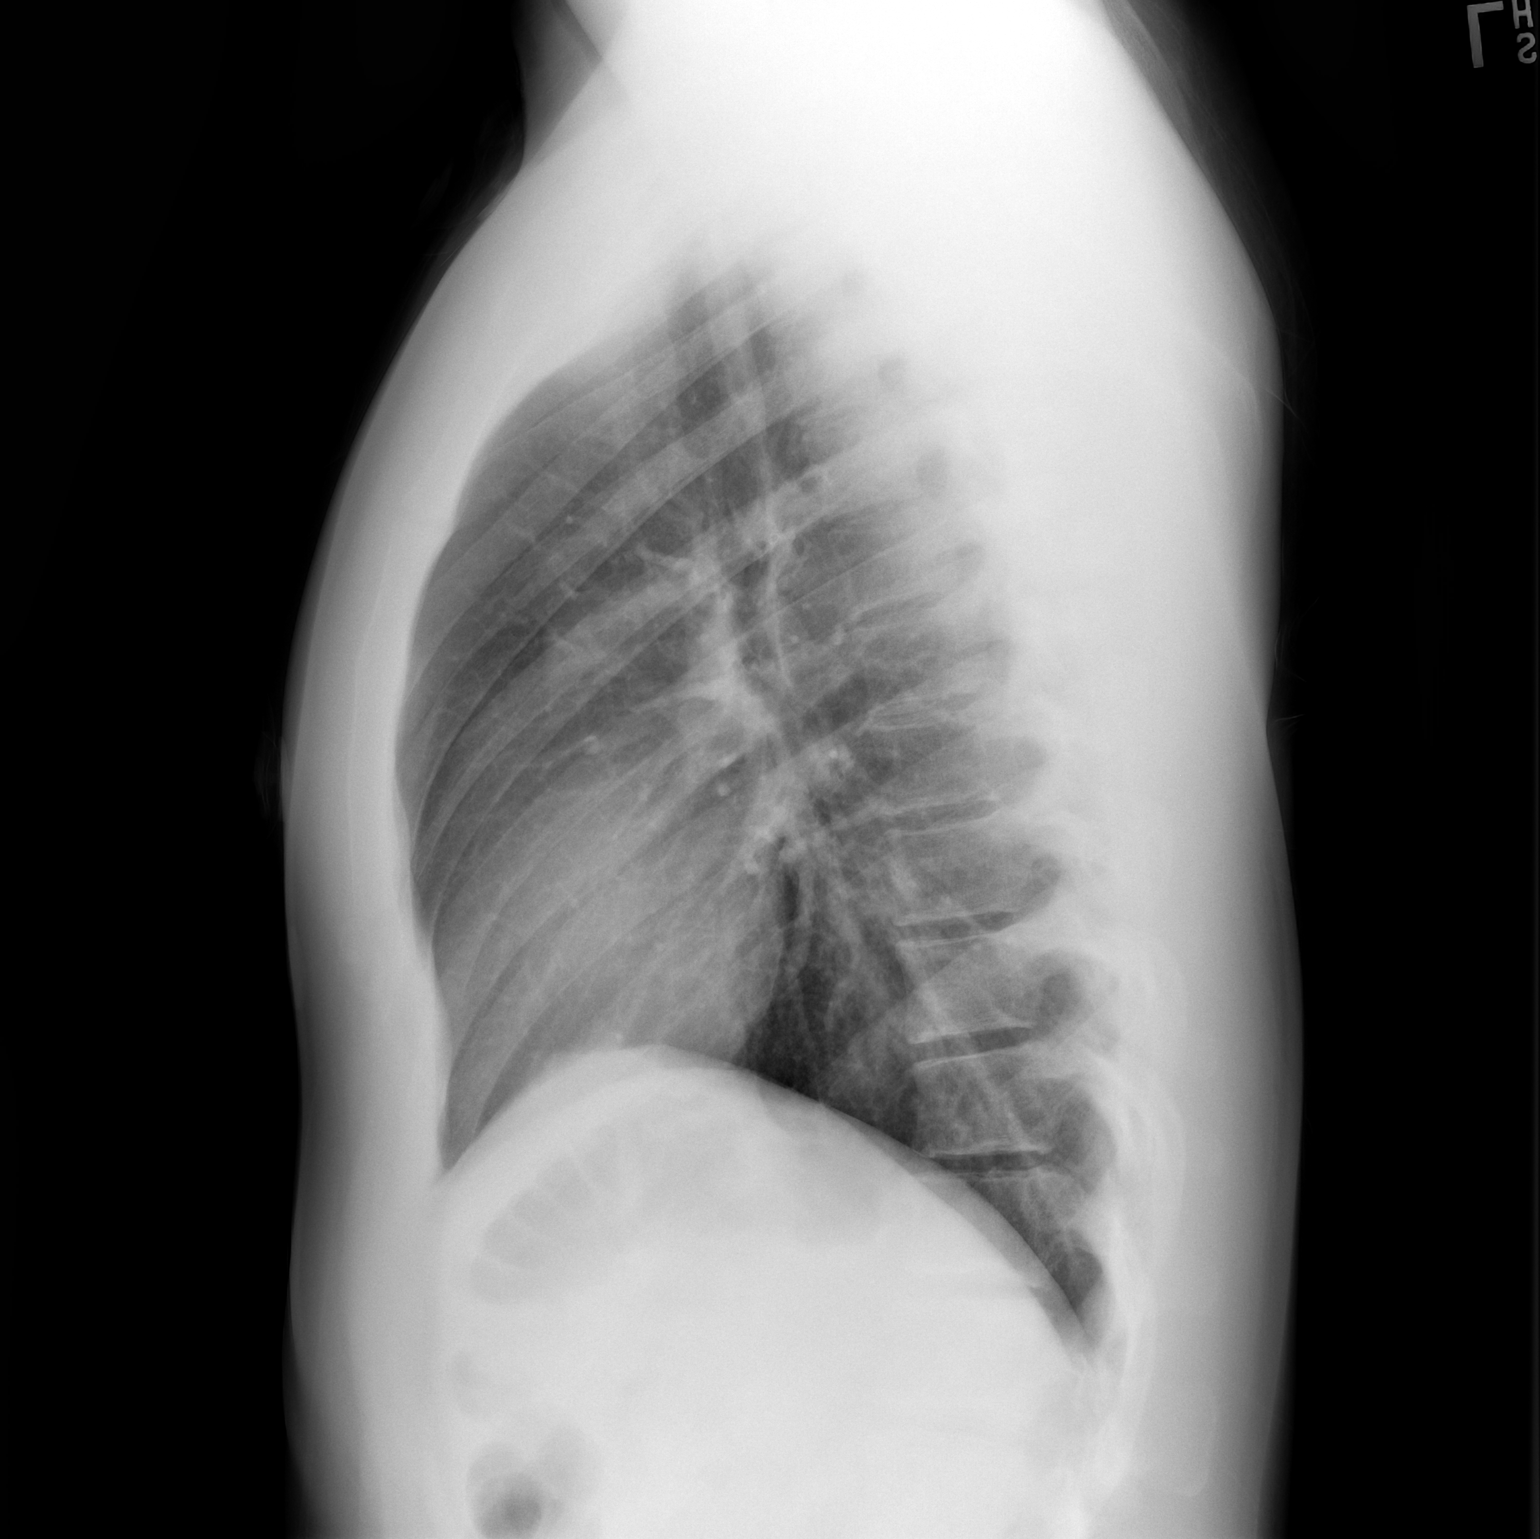

[2 of 2 positions shown; findings below may reference images not displayed]

FINDINGS: The heart size and mediastinal contours are within normal limits.

No focal consolidation. No pulmonary edema. No pleural effusion. No
pneumothorax.

No acute osseous abnormality.
IMPRESSION: No active cardiopulmonary disease.
